# Patient Record
Sex: Female | Born: 1985 | Race: White | Hispanic: No | Marital: Single | Smoking: Never smoker
Health system: Southern US, Community
[De-identification: ages and names within clinical notes are randomized; demographics above are authoritative.]

## PROBLEM LIST (undated history)

## (undated) DIAGNOSIS — F329 Major depressive disorder, single episode, unspecified: Secondary | ICD-10-CM

## (undated) DIAGNOSIS — F419 Anxiety disorder, unspecified: Secondary | ICD-10-CM

## (undated) DIAGNOSIS — F319 Bipolar disorder, unspecified: Secondary | ICD-10-CM

## (undated) DIAGNOSIS — E049 Nontoxic goiter, unspecified: Secondary | ICD-10-CM

## (undated) DIAGNOSIS — T7840XA Allergy, unspecified, initial encounter: Secondary | ICD-10-CM

## (undated) DIAGNOSIS — F32A Depression, unspecified: Secondary | ICD-10-CM

## (undated) HISTORY — DX: Anxiety disorder, unspecified: F41.9

## (undated) HISTORY — DX: Allergy, unspecified, initial encounter: T78.40XA

## (undated) HISTORY — DX: Major depressive disorder, single episode, unspecified: F32.9

## (undated) HISTORY — DX: Bipolar disorder, unspecified: F31.9

## (undated) HISTORY — DX: Depression, unspecified: F32.A

## (undated) HISTORY — DX: Nontoxic goiter, unspecified: E04.9

---
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
## 2007-01-11 ENCOUNTER — Encounter: Admission: RE | Admit: 2007-01-11 | Discharge: 2007-01-11 | Payer: Self-pay | Admitting: Internal Medicine

## 2010-07-15 NOTE — Op Note (Signed)
. [facility]  Patient:    Amber Schultz, Amber Schultz                   MRN: [id number] Proc. Date: 07/31/00 Adm. Date:  [id number] Attending:  Eloise Levels                           Operative Report  PREOPERATIVE DIAGNOSES: 1. A 1.1 cm dysplastic nevus, left abdomen. 2. A 0.8 cm suspicious skin lesion, left upper arm.  POSTOPERATIVE DIAGNOSES: 1. A 1.1 cm dysplastic nevus, left abdomen. 2. A 0.8 cm suspicious skin lesion, left upper arm.  PROCEDURES: 1. Excision of a 1.1 cm dysplastic nevus, left abdomen. 2. Complex closure of 1.9 cm incision, left abdomen. 3. Excision of 0.8 cm suspicious skin lesion, left upper arm. 4. Complex closure of 1.7 cm incision, left upper arm.  SURGEON:  A. Contogiannis, M.D.  ANESTHESIA:  1% lidocaine with epinephrine along with topical EMLA cream.  COMPLICATIONS:  None.  INDICATIONS FOR PROCEDURE:  The patient is a 25 year old Caucasian female who has a dysplastic nevus on the left abdomen as well as a suspicious skin lesion on the left upper arm.  She has been referred to me by [facility]. Danella Deis, M.D., for evaluation and excision.  The patient and her mother request that I proceed with excision of these lesions at this time.  DESCRIPTION OF PROCEDURE:  The patient arrived one hour prior to her surgery and had topical EMLA cream placed over the left abdominal and the left upper arm skin lesions to be excised.  She was then taken back into the minor procedure room and laid on the OR table in the supine position.  The EMLA cream was removed, and the left abdomen and left upper arm were prepped with Betadine and draped in sterile fashion.  The skin and subcutaneous tissues around both skin lesions were then injected with 1% lidocaine with epinephrine.  After adequate hemostasis and anesthesia had taken effect, the procedure was begun.  First, the left abdominal dysplastic nevus was excised with at least 1  mm of normal skin in all directions.  This was excised full-thickness through the skin into the subcutaneous fatty tissues.  The specimen was marked at the 12 oclock position with a silk suture and passed off the table to undergo permanent pathologic section evaluation.  I felt that perhaps part of the margin from the 4 oclock to the 9 oclock position may have been close, so I excised another margin along this area and inked the internal surface.  This was also sent with the specimen for pathologic evaluation.  The skin edges were then undermined for easy closure.  The wound was then closed in complex fashion.  The deeper subcutaneous tissues were closed with a 3-0 Monocryl suture.  The dermal layer was then closed with 3-0 and 4-0 Vicryl Monocryl sutures as appropriate.  The skin was then closed with a 4-0 Monocryl running intracuticular stitch on the skin.  Attention was then turned to the left upper arm skin lesion.  This skin lesion was excised with a millimeter of normal skin all around full-thickness into the subcutaneous tissue.  The lesion was then marked at the 12 oclock position with a silk suture, passed off the table to undergo permanent pathologic section evaluation.  The skin edges were then allowed to relax and the incision closed along the lines of skin tension.  The wound was closed in complex fashion. The deeper subcutaneous tissues were closed with a 3-0 Monocryl suture.  The dermal layer was then closed with 4-0 Monocryl interrupted sutures.  The skin was closed with a 4-0 Monocryl running intracuticular stitch.  Both incisions were dressed with benzoin and Steri-Strips.  There were no complications.  The patient tolerated the procedure well.  She was then discharged home in the care of her mother in stable condition.  She was given the following instructions:  1. Finish taking your Keflex one tablet four times a day. 2. Keep both areas of the wounds dry until your  follow-up appointment. 3. Return to the office tomorrow for a follow-up appointment. 4. Call my office at [phone] if any problems arise or any concerns. DD:  07/31/00 TD:  08/01/00 Job: [id number]

## 2010-07-19 ENCOUNTER — Other Ambulatory Visit: Payer: Self-pay | Admitting: Internal Medicine

## 2010-07-19 DIAGNOSIS — E049 Nontoxic goiter, unspecified: Secondary | ICD-10-CM

## 2010-07-22 ENCOUNTER — Ambulatory Visit
Admission: RE | Admit: 2010-07-22 | Discharge: 2010-07-22 | Disposition: A | Discharge status: Home / Self Care | Payer: Medicare Other | Source: Ambulatory Visit | Attending: Internal Medicine | Admitting: Internal Medicine

## 2010-07-22 DIAGNOSIS — E049 Nontoxic goiter, unspecified: Secondary | ICD-10-CM

## 2010-10-14 ENCOUNTER — Other Ambulatory Visit: Payer: Self-pay | Admitting: *Deleted

## 2010-10-14 DIAGNOSIS — E049 Nontoxic goiter, unspecified: Secondary | ICD-10-CM

## 2010-10-17 ENCOUNTER — Other Ambulatory Visit: Payer: Medicare Other

## 2011-03-09 DIAGNOSIS — F3189 Other bipolar disorder: Secondary | ICD-10-CM | POA: Diagnosis not present

## 2011-03-09 DIAGNOSIS — F410 Panic disorder [episodic paroxysmal anxiety] without agoraphobia: Secondary | ICD-10-CM | POA: Diagnosis not present

## 2011-04-13 DIAGNOSIS — F3189 Other bipolar disorder: Secondary | ICD-10-CM | POA: Diagnosis not present

## 2011-04-13 DIAGNOSIS — F410 Panic disorder [episodic paroxysmal anxiety] without agoraphobia: Secondary | ICD-10-CM | POA: Diagnosis not present

## 2011-07-13 DIAGNOSIS — F410 Panic disorder [episodic paroxysmal anxiety] without agoraphobia: Secondary | ICD-10-CM | POA: Diagnosis not present

## 2011-07-13 DIAGNOSIS — F3189 Other bipolar disorder: Secondary | ICD-10-CM | POA: Diagnosis not present

## 2011-09-05 DIAGNOSIS — R5381 Other malaise: Secondary | ICD-10-CM | POA: Diagnosis not present

## 2011-09-05 DIAGNOSIS — R079 Chest pain, unspecified: Secondary | ICD-10-CM | POA: Diagnosis not present

## 2011-09-05 DIAGNOSIS — R5383 Other fatigue: Secondary | ICD-10-CM | POA: Diagnosis not present

## 2011-09-05 DIAGNOSIS — Z0000 Encounter for general adult medical examination without abnormal findings: Secondary | ICD-10-CM | POA: Diagnosis not present

## 2011-09-12 DIAGNOSIS — H908 Mixed conductive and sensorineural hearing loss, unspecified: Secondary | ICD-10-CM | POA: Diagnosis not present

## 2011-09-12 DIAGNOSIS — Z0000 Encounter for general adult medical examination without abnormal findings: Secondary | ICD-10-CM | POA: Diagnosis not present

## 2011-09-12 DIAGNOSIS — F39 Unspecified mood [affective] disorder: Secondary | ICD-10-CM | POA: Diagnosis not present

## 2011-10-05 DIAGNOSIS — F3189 Other bipolar disorder: Secondary | ICD-10-CM | POA: Diagnosis not present

## 2011-11-17 DIAGNOSIS — Z124 Encounter for screening for malignant neoplasm of cervix: Secondary | ICD-10-CM | POA: Diagnosis not present

## 2011-11-17 DIAGNOSIS — Z0000 Encounter for general adult medical examination without abnormal findings: Secondary | ICD-10-CM | POA: Diagnosis not present

## 2011-11-17 DIAGNOSIS — Z01419 Encounter for gynecological examination (general) (routine) without abnormal findings: Secondary | ICD-10-CM | POA: Diagnosis not present

## 2012-01-08 DIAGNOSIS — F3189 Other bipolar disorder: Secondary | ICD-10-CM | POA: Diagnosis not present

## 2012-03-15 DIAGNOSIS — F3189 Other bipolar disorder: Secondary | ICD-10-CM | POA: Diagnosis not present

## 2012-08-22 DIAGNOSIS — F3189 Other bipolar disorder: Secondary | ICD-10-CM | POA: Diagnosis not present

## 2012-10-10 DIAGNOSIS — F319 Bipolar disorder, unspecified: Secondary | ICD-10-CM | POA: Insufficient documentation

## 2012-10-10 DIAGNOSIS — F3178 Bipolar disorder, in full remission, most recent episode mixed: Secondary | ICD-10-CM | POA: Diagnosis not present

## 2012-10-10 DIAGNOSIS — F411 Generalized anxiety disorder: Secondary | ICD-10-CM | POA: Diagnosis not present

## 2012-11-28 DIAGNOSIS — F411 Generalized anxiety disorder: Secondary | ICD-10-CM | POA: Diagnosis not present

## 2012-11-28 DIAGNOSIS — F3178 Bipolar disorder, in full remission, most recent episode mixed: Secondary | ICD-10-CM | POA: Diagnosis not present

## 2013-03-20 DIAGNOSIS — F4010 Social phobia, unspecified: Secondary | ICD-10-CM | POA: Diagnosis not present

## 2013-03-20 DIAGNOSIS — F411 Generalized anxiety disorder: Secondary | ICD-10-CM | POA: Diagnosis not present

## 2013-03-20 DIAGNOSIS — F3178 Bipolar disorder, in full remission, most recent episode mixed: Secondary | ICD-10-CM | POA: Diagnosis not present

## 2013-06-05 DIAGNOSIS — F3131 Bipolar disorder, current episode depressed, mild: Secondary | ICD-10-CM | POA: Diagnosis not present

## 2013-06-05 DIAGNOSIS — F411 Generalized anxiety disorder: Secondary | ICD-10-CM | POA: Diagnosis not present

## 2013-06-05 DIAGNOSIS — F4010 Social phobia, unspecified: Secondary | ICD-10-CM | POA: Diagnosis not present

## 2013-07-10 DIAGNOSIS — F4010 Social phobia, unspecified: Secondary | ICD-10-CM | POA: Diagnosis not present

## 2013-07-10 DIAGNOSIS — F411 Generalized anxiety disorder: Secondary | ICD-10-CM | POA: Diagnosis not present

## 2013-07-10 DIAGNOSIS — F3178 Bipolar disorder, in full remission, most recent episode mixed: Secondary | ICD-10-CM | POA: Diagnosis not present

## 2013-08-21 DIAGNOSIS — F3110 Bipolar disorder, current episode manic without psychotic features, unspecified: Secondary | ICD-10-CM | POA: Diagnosis not present

## 2013-08-21 DIAGNOSIS — F411 Generalized anxiety disorder: Secondary | ICD-10-CM | POA: Diagnosis not present

## 2013-08-21 DIAGNOSIS — F4010 Social phobia, unspecified: Secondary | ICD-10-CM | POA: Diagnosis not present

## 2013-09-18 DIAGNOSIS — F4010 Social phobia, unspecified: Secondary | ICD-10-CM | POA: Diagnosis not present

## 2013-09-18 DIAGNOSIS — F3110 Bipolar disorder, current episode manic without psychotic features, unspecified: Secondary | ICD-10-CM | POA: Diagnosis not present

## 2013-09-18 DIAGNOSIS — F411 Generalized anxiety disorder: Secondary | ICD-10-CM | POA: Diagnosis not present

## 2013-10-30 DIAGNOSIS — F3110 Bipolar disorder, current episode manic without psychotic features, unspecified: Secondary | ICD-10-CM | POA: Diagnosis not present

## 2013-11-19 DIAGNOSIS — J029 Acute pharyngitis, unspecified: Secondary | ICD-10-CM | POA: Diagnosis not present

## 2013-12-11 DIAGNOSIS — R591 Generalized enlarged lymph nodes: Secondary | ICD-10-CM | POA: Diagnosis not present

## 2013-12-11 DIAGNOSIS — J029 Acute pharyngitis, unspecified: Secondary | ICD-10-CM | POA: Diagnosis not present

## 2013-12-12 DIAGNOSIS — F310 Bipolar disorder, current episode hypomanic: Secondary | ICD-10-CM | POA: Diagnosis not present

## 2013-12-12 DIAGNOSIS — F411 Generalized anxiety disorder: Secondary | ICD-10-CM | POA: Diagnosis not present

## 2013-12-12 DIAGNOSIS — F4010 Social phobia, unspecified: Secondary | ICD-10-CM | POA: Diagnosis not present

## 2013-12-24 DIAGNOSIS — H5213 Myopia, bilateral: Secondary | ICD-10-CM | POA: Diagnosis not present

## 2013-12-24 DIAGNOSIS — H52203 Unspecified astigmatism, bilateral: Secondary | ICD-10-CM | POA: Diagnosis not present

## 2013-12-30 DIAGNOSIS — J310 Chronic rhinitis: Secondary | ICD-10-CM | POA: Diagnosis not present

## 2013-12-30 DIAGNOSIS — J351 Hypertrophy of tonsils: Secondary | ICD-10-CM | POA: Diagnosis not present

## 2014-01-08 DIAGNOSIS — Z0142 Encounter for cervical smear to confirm findings of recent normal smear following initial abnormal smear: Secondary | ICD-10-CM | POA: Diagnosis not present

## 2014-01-08 DIAGNOSIS — Z124 Encounter for screening for malignant neoplasm of cervix: Secondary | ICD-10-CM | POA: Diagnosis not present

## 2014-01-08 DIAGNOSIS — Z113 Encounter for screening for infections with a predominantly sexual mode of transmission: Secondary | ICD-10-CM | POA: Diagnosis not present

## 2014-01-08 DIAGNOSIS — Z0000 Encounter for general adult medical examination without abnormal findings: Secondary | ICD-10-CM | POA: Diagnosis not present

## 2014-01-08 DIAGNOSIS — N943 Premenstrual tension syndrome: Secondary | ICD-10-CM | POA: Diagnosis not present

## 2014-01-08 DIAGNOSIS — Z1272 Encounter for screening for malignant neoplasm of vagina: Secondary | ICD-10-CM | POA: Diagnosis not present

## 2014-01-08 DIAGNOSIS — Z01419 Encounter for gynecological examination (general) (routine) without abnormal findings: Secondary | ICD-10-CM | POA: Diagnosis not present

## 2014-01-08 DIAGNOSIS — Z118 Encounter for screening for other infectious and parasitic diseases: Secondary | ICD-10-CM | POA: Diagnosis not present

## 2014-01-08 DIAGNOSIS — Z793 Long term (current) use of hormonal contraceptives: Secondary | ICD-10-CM | POA: Diagnosis not present

## 2014-01-16 DIAGNOSIS — F319 Bipolar disorder, unspecified: Secondary | ICD-10-CM | POA: Diagnosis not present

## 2014-03-03 DIAGNOSIS — J351 Hypertrophy of tonsils: Secondary | ICD-10-CM | POA: Diagnosis not present

## 2014-03-18 DIAGNOSIS — F3174 Bipolar disorder, in full remission, most recent episode manic: Secondary | ICD-10-CM | POA: Diagnosis not present

## 2014-03-18 DIAGNOSIS — J351 Hypertrophy of tonsils: Secondary | ICD-10-CM | POA: Diagnosis not present

## 2014-03-18 DIAGNOSIS — Z681 Body mass index (BMI) 19 or less, adult: Secondary | ICD-10-CM | POA: Diagnosis not present

## 2014-03-18 DIAGNOSIS — N943 Premenstrual tension syndrome: Secondary | ICD-10-CM | POA: Diagnosis not present

## 2014-03-18 DIAGNOSIS — E042 Nontoxic multinodular goiter: Secondary | ICD-10-CM | POA: Diagnosis not present

## 2014-04-24 DIAGNOSIS — F319 Bipolar disorder, unspecified: Secondary | ICD-10-CM | POA: Diagnosis not present

## 2014-04-24 DIAGNOSIS — F411 Generalized anxiety disorder: Secondary | ICD-10-CM | POA: Diagnosis not present

## 2014-04-24 DIAGNOSIS — F4010 Social phobia, unspecified: Secondary | ICD-10-CM | POA: Diagnosis not present

## 2014-06-05 DIAGNOSIS — F3132 Bipolar disorder, current episode depressed, moderate: Secondary | ICD-10-CM | POA: Diagnosis not present

## 2014-06-05 DIAGNOSIS — F4010 Social phobia, unspecified: Secondary | ICD-10-CM | POA: Diagnosis not present

## 2014-06-05 DIAGNOSIS — F902 Attention-deficit hyperactivity disorder, combined type: Secondary | ICD-10-CM | POA: Diagnosis not present

## 2014-06-05 DIAGNOSIS — F411 Generalized anxiety disorder: Secondary | ICD-10-CM | POA: Diagnosis not present

## 2014-07-03 DIAGNOSIS — Z681 Body mass index (BMI) 19 or less, adult: Secondary | ICD-10-CM | POA: Diagnosis not present

## 2014-07-03 DIAGNOSIS — R197 Diarrhea, unspecified: Secondary | ICD-10-CM | POA: Diagnosis not present

## 2014-07-03 DIAGNOSIS — K529 Noninfective gastroenteritis and colitis, unspecified: Secondary | ICD-10-CM | POA: Diagnosis not present

## 2014-09-25 DIAGNOSIS — R350 Frequency of micturition: Secondary | ICD-10-CM | POA: Diagnosis not present

## 2014-09-28 DIAGNOSIS — Z681 Body mass index (BMI) 19 or less, adult: Secondary | ICD-10-CM | POA: Diagnosis not present

## 2014-09-28 DIAGNOSIS — R319 Hematuria, unspecified: Secondary | ICD-10-CM | POA: Diagnosis not present

## 2014-09-28 DIAGNOSIS — N390 Urinary tract infection, site not specified: Secondary | ICD-10-CM | POA: Diagnosis not present

## 2014-11-02 DIAGNOSIS — N3000 Acute cystitis without hematuria: Secondary | ICD-10-CM | POA: Diagnosis not present

## 2014-11-02 DIAGNOSIS — Z882 Allergy status to sulfonamides status: Secondary | ICD-10-CM | POA: Diagnosis not present

## 2014-11-02 DIAGNOSIS — Z79899 Other long term (current) drug therapy: Secondary | ICD-10-CM | POA: Diagnosis not present

## 2014-11-02 DIAGNOSIS — Z880 Allergy status to penicillin: Secondary | ICD-10-CM | POA: Diagnosis not present

## 2014-11-02 DIAGNOSIS — Z886 Allergy status to analgesic agent status: Secondary | ICD-10-CM | POA: Diagnosis not present

## 2014-12-11 DIAGNOSIS — F4010 Social phobia, unspecified: Secondary | ICD-10-CM | POA: Diagnosis not present

## 2014-12-11 DIAGNOSIS — F411 Generalized anxiety disorder: Secondary | ICD-10-CM | POA: Diagnosis not present

## 2014-12-11 DIAGNOSIS — F3132 Bipolar disorder, current episode depressed, moderate: Secondary | ICD-10-CM | POA: Diagnosis not present

## 2014-12-11 DIAGNOSIS — F902 Attention-deficit hyperactivity disorder, combined type: Secondary | ICD-10-CM | POA: Diagnosis not present

## 2014-12-31 DIAGNOSIS — Z793 Long term (current) use of hormonal contraceptives: Secondary | ICD-10-CM | POA: Diagnosis not present

## 2014-12-31 DIAGNOSIS — Z3041 Encounter for surveillance of contraceptive pills: Secondary | ICD-10-CM | POA: Diagnosis not present

## 2014-12-31 DIAGNOSIS — Z3202 Encounter for pregnancy test, result negative: Secondary | ICD-10-CM | POA: Diagnosis not present

## 2015-05-16 ENCOUNTER — Ambulatory Visit (INDEPENDENT_AMBULATORY_CARE_PROVIDER_SITE_OTHER): Payer: Medicare Other | Admitting: Emergency Medicine

## 2015-05-16 VITALS — BP 130/76 | HR 81 | Temp 98.1°F | Resp 14 | Ht 69.25 in | Wt 132.0 lb

## 2015-05-16 DIAGNOSIS — R350 Frequency of micturition: Secondary | ICD-10-CM

## 2015-05-16 DIAGNOSIS — F3170 Bipolar disorder, currently in remission, most recent episode unspecified: Secondary | ICD-10-CM | POA: Diagnosis not present

## 2015-05-16 LAB — POCT URINALYSIS DIP (MANUAL ENTRY)
BILIRUBIN UA: NEGATIVE
BILIRUBIN UA: NEGATIVE
Blood, UA: NEGATIVE
GLUCOSE UA: NEGATIVE
LEUKOCYTES UA: NEGATIVE
Nitrite, UA: NEGATIVE
PROTEIN UA: NEGATIVE
SPEC GRAV UA: 1.01
Urobilinogen, UA: 0.2
pH, UA: 7

## 2015-05-16 LAB — POCT CBC
GRANULOCYTE PERCENT: 89.1 % — AB (ref 37–80)
HEMATOCRIT: 37.9 % (ref 37.7–47.9)
Hemoglobin: 13.1 g/dL (ref 12.2–16.2)
Lymph, poc: 0.8 (ref 0.6–3.4)
MCH: 30 pg (ref 27–31.2)
MCHC: 34.5 g/dL (ref 31.8–35.4)
MCV: 87 fL (ref 80–97)
MID (CBC): 0.2 (ref 0–0.9)
MPV: 9.6 fL (ref 0–99.8)
POC GRANULOCYTE: 8 — AB (ref 2–6.9)
POC LYMPH %: 8.5 % — AB (ref 10–50)
POC MID %: 2.4 % (ref 0–12)
Platelet Count, POC: 144 10*3/uL (ref 142–424)
RBC: 4.35 M/uL (ref 4.04–5.48)
RDW, POC: 13 %
WBC: 9 10*3/uL (ref 4.6–10.2)

## 2015-05-16 LAB — POC MICROSCOPIC URINALYSIS (UMFC): MUCUS RE: ABSENT

## 2015-05-16 LAB — POCT URINE PREGNANCY: PREG TEST UR: NEGATIVE

## 2015-05-16 LAB — GLUCOSE, POCT (MANUAL RESULT ENTRY): POC Glucose: 89 mg/dl (ref 70–99)

## 2015-05-16 NOTE — Progress Notes (Addendum)
By signing my name below, I, Javier Docker, attest that this documentation has been prepared under the direction and in the presence of Earl Lites, MD. Electronically Signed: Javier Docker, ER Scribe. 05/16/2015. 3:15 PM.  Chief Complaint:  Chief Complaint  Patient presents with  . Urinary Tract Infection  . Urinary Frequency  . Labs Only    Kidney function  . Side effect    To Macrobid   HPI: Amber Schultz is a 30 y.o. female who reports to [facility] today reporting for a kidney fx test. She takes lithium, one  in the morning and one  at night. She has been on that medication for 17 years. She was seen with urinary frequency and foamy urine one week ago by Dr. Wylene Simmer, and was put on macrobid. Since that time she has felt disoriented, which she associates with the Macrobid. She has had severe drug side effects in the past. She does not have a period due to continuous birth control. She has had no change in sexual partners. Her psychiatrist asked her to go get kidney fx test when she started macrobid.    Past Medical History  Diagnosis Date  . Allergy   . Anxiety   . Depression   . Bipolar 1 disorder (HCC)   . Goiter    History reviewed. No pertinent past surgical history. Social History   Social History  . Marital Status: Single    Spouse Name: N/A  . Number of Children: N/A  . Years of Education: N/A   Social History Main Topics  . Smoking status: Never Smoker   . Smokeless tobacco: None  . Alcohol Use: None  . Drug Use: None  . Sexual Activity: Not Asked   Other Topics Concern  . None   Social History Narrative  . None   Family History  Problem Relation Age of Onset  . Hypertension Mother   . Cancer Maternal Grandmother   . Cancer Maternal Grandfather   . Hypertension Paternal Grandmother   . Cancer Paternal Grandfather    Allergies  Allergen Reactions  . Aleve [Naproxen Sodium]   . Aspirin   . Ciprofloxacin Hcl   . Ibuprofen   .  Penicillins   . Sulfa Antibiotics    Prior to Admission medications   Medication Sig Start Date End Date Taking? Authorizing Provider  drospirenone-ethinyl estradiol (YAZ,GIANVI,LORYNA) 3-0.02 MG tablet Take 1 tablet by mouth daily.   Yes Historical Provider, MD  lamoTRIgine (LAMICTAL) 100 MG tablet Take 100 mg by mouth daily.   Yes Historical Provider, MD  lithium carbonate 300 MG capsule Take 300 mg by mouth 3 (three) times daily with meals.   Yes Historical Provider, MD  nitrofurantoin, macrocrystal-monohydrate, (MACROBID) 100 MG capsule Take 100 mg by mouth 2 (two) times daily.   Yes Historical Provider, MD    ROS: The patient denies fevers, chills, night sweats, unintentional weight loss, chest pain, palpitations, wheezing, dyspnea on exertion, nausea, vomiting, abdominal pain, dysuria, hematuria, melena, numbness, weakness, or tingling.   All other systems have been reviewed and were otherwise negative with the exception of those mentioned in the HPI and as above.    PHYSICAL EXAM: Filed Vitals:   05/16/15 1408  BP: 130/76  Pulse: 81  Temp: 98.1 F (36.7 C)  Resp: 14   Body mass index is 19.35 kg/(m^2).   General: Alert, no acute distress HEENT:  Normocephalic, atraumatic, oropharynx patent. Eye: Nonie Hoyer [facility] Cardiovascular:  Regular rate and rhythm,  no rubs murmurs or gallops.  No Carotid bruits, radial pulse intact. No pedal edema.  Respiratory: Clear to auscultation bilaterally.  No wheezes, rales, or rhonchi.  No cyanosis, no use of accessory musculature Abdominal: No organomegaly, abdomen is soft and non-tender, positive bowel sounds.  No masses. Musculoskeletal: Gait intact. No edema, tenderness Skin: No rashes. Neurologic: Facial musculature symmetric. Psychiatric: Patient acts appropriately throughout our interaction. Lymphatic: No cervical or submandibular lymphadenopathy    LABS: Results for orders placed or performed in visit on 05/16/15  POCT CBC  Result  Value Ref Range   WBC 9.0 4.6 - 10.2 K/uL   Lymph, poc 0.8 0.6 - 3.4   POC LYMPH PERCENT 8.5 (A) 10 - 50 %L   MID (cbc) 0.2 0 - 0.9   POC MID % 2.4 0 - 12 %M   POC Granulocyte 8.0 (A) 2 - 6.9   Granulocyte percent 89.1 (A) 37 - 80 %G   RBC 4.35 4.04 - 5.48 M/uL   Hemoglobin 13.1 12.2 - 16.2 g/dL   HCT, POC 84.637.9 96.237.7 - 47.9 %   MCV 87.0 80 - 97 fL   MCH, POC 30.0 27 - 31.2 pg   MCHC 34.5 31.8 - 35.4 g/dL   RDW, POC 95.213.0 %   Platelet Count, POC 144 142 - 424 K/uL   MPV 9.6 0 - 99.8 fL  POCT glucose (manual entry)  Result Value Ref Range   POC Glucose 89 70 - 99 mg/dl  POCT urinalysis dipstick  Result Value Ref Range   Color, UA yellow yellow   Clarity, UA clear clear   Glucose, UA negative negative   Bilirubin, UA negative negative   Ketones, POC UA negative negative   Spec Grav, UA 1.010    Blood, UA negative negative   pH, UA 7.0    Protein Ur, POC negative negative   Urobilinogen, UA 0.2    Nitrite, UA Negative Negative   Leukocytes, UA Negative Negative  POCT Microscopic Urinalysis ([facility])  Result Value Ref Range   WBC,UR,HPF,POC Few (A) None WBC/hpf   RBC,UR,HPF,POC None None RBC/hpf   Bacteria Few (A) None, Too numerous to count   Mucus Absent Absent   Epithelial Cells, UR Per Microscopy Few (A) None, Too numerous to count cells/hpf   Yeast, UA Present   POCT urine pregnancy  Result Value Ref Range   Preg Test, Ur Negative Negative    EKG/XRAY:   [facility]   ASSESSMENT/PLAN: Blood work and urine looked normal. Go ahead and do a urine culture and check renal function and lithium level.I personally performed the services described in this documentation, which was scribed in my presence. The recorded information has been reviewed and is accurate. Patient was the same sexual partner for years does not desire STD testing at the present time. I personally performed the services described in this documentation, which was scribed in  my presence. The recorded information has been reviewed and is accurate.  Gross sideeffects, risk and benefits, and alternatives of medications d/w patient. Patient is aware that all medications have potential sideeffects and we are unable to predict every sideeffect or drug-drug interaction that may occur.  Lesle ChrisSteven Burt Piatek MD 05/16/2015 3:15 PM

## 2015-05-16 NOTE — Patient Instructions (Signed)
Please stop your Macrobid. I will call you with culture results. I will call you with results of your lithium level and renal function tests.

## 2015-05-17 ENCOUNTER — Telehealth: Payer: Self-pay

## 2015-05-17 ENCOUNTER — Ambulatory Visit (INDEPENDENT_AMBULATORY_CARE_PROVIDER_SITE_OTHER): Payer: Medicare Other | Admitting: Internal Medicine

## 2015-05-17 DIAGNOSIS — F419 Anxiety disorder, unspecified: Secondary | ICD-10-CM

## 2015-05-17 DIAGNOSIS — R202 Paresthesia of skin: Secondary | ICD-10-CM

## 2015-05-17 DIAGNOSIS — R350 Frequency of micturition: Secondary | ICD-10-CM

## 2015-05-17 LAB — BASIC METABOLIC PANEL WITH GFR
BUN: 10 mg/dL (ref 7–25)
CO2: 26 mmol/L (ref 20–31)
Calcium: 9.3 mg/dL (ref 8.6–10.2)
Chloride: 105 mmol/L (ref 98–110)
Creat: 0.77 mg/dL (ref 0.50–1.10)
Glucose, Bld: 84 mg/dL (ref 65–99)
POTASSIUM: 4.3 mmol/L (ref 3.5–5.3)
SODIUM: 138 mmol/L (ref 135–146)

## 2015-05-17 LAB — LITHIUM LEVEL: LITHIUM LVL: 0.6 meq/L — AB (ref 0.80–1.40)

## 2015-05-17 NOTE — Telephone Encounter (Signed)
I called and spoke with the father. I told him her blood work and urine looked essentially normal lithium level was somewhat low at 0.6. He states the problems with her back and legs began last night. I advised him to bring her back in for reevaluation of these problems. She did not have these symptoms when seen yesterday.

## 2015-05-17 NOTE — Patient Instructions (Signed)
Cephalosporins  [email or web address]  Results for orders placed or performed in visit on 05/16/15  BASIC METABOLIC PANEL WITH GFR  Result Value Ref Range   Sodium 138 135 - 146 mmol/L   Potassium 4.3 3.5 - 5.3 mmol/L   Chloride 105 98 - 110 mmol/L   CO2 26 20 - 31 mmol/L   Glucose, Bld 84 65 - 99 mg/dL   BUN 10 7 - 25 mg/dL   Creat 9.600.77 4.540.50 - 0.981.10 mg/dL   Calcium 9.3 8.6 - 11.910.2 mg/dL   GFR, Est African American >89 >=60 mL/min   GFR, Est Non African American >89 >=60 mL/min  Lithium level  Result Value Ref Range   Lithium Lvl 0.60 (L) 0.80 - 1.40 mEq/L  POCT CBC  Result Value Ref Range   WBC 9.0 4.6 - 10.2 K/uL   Lymph, poc 0.8 0.6 - 3.4   POC LYMPH PERCENT 8.5 (A) 10 - 50 %L   MID (cbc) 0.2 0 - 0.9   POC MID % 2.4 0 - 12 %M   POC Granulocyte 8.0 (A) 2 - 6.9   Granulocyte percent 89.1 (A) 37 - 80 %G   RBC 4.35 4.04 - 5.48 M/uL   Hemoglobin 13.1 12.2 - 16.2 g/dL   HCT, POC 14.737.9 82.937.7 - 47.9 %   MCV 87.0 80 - 97 fL   MCH, POC 30.0 27 - 31.2 pg   MCHC 34.5 31.8 - 35.4 g/dL   RDW, POC 56.213.0 %   Platelet Count, POC 144 142 - 424 K/uL   MPV 9.6 0 - 99.8 fL  POCT glucose (manual entry)  Result Value Ref Range   POC Glucose 89 70 - 99 mg/dl  POCT urinalysis dipstick  Result Value Ref Range   Color, UA yellow yellow   Clarity, UA clear clear   Glucose, UA negative negative   Bilirubin, UA negative negative   Ketones, POC UA negative negative   Spec Grav, UA 1.010    Blood, UA negative negative   pH, UA 7.0    Protein Ur, POC negative negative   Urobilinogen, UA 0.2    Nitrite, UA Negative Negative   Leukocytes, UA Negative Negative  POCT Microscopic Urinalysis ([facility])  Result Value Ref Range   WBC,UR,HPF,POC Few (A) None WBC/hpf   RBC,UR,HPF,POC None None RBC/hpf   Bacteria Few (A) None, Too numerous to count   Mucus Absent Absent   Epithelial Cells, UR Per Microscopy Few (A) None, Too numerous to count cells/hpf   Yeast, UA Present   POCT urine pregnancy  Result Value Ref  Range   Preg Test, Ur Negative Negative

## 2015-05-17 NOTE — Telephone Encounter (Signed)
Amber Schultz states her daughter was seen yesterday and her symptoms are getting worse. She now have Low Back Pain that have moved to her hips and knees now, she would like to speak  With someone. Please call [phone]

## 2015-05-17 NOTE — Progress Notes (Signed)
Subjective:  By signing my name below, I, Linus Galas, attest that this documentation has been prepared under the direction and in the presence of Robert P. Merla Riches, MD. Electronically Signed: Linus Galas, ED Scribe. 05/17/2015. 5:07 PM.   Patient ID: Amber Schultz, female    DOB: Sep 30, 1985, 30 y.o.   MRN: [id number] Chief Complaint  Patient presents with  . Allergic Reaction    Possible reaction to macrobid, severe back pain, tingling in legs    HPI  HPI Comments: Amber Schultz is a 30 y.o. female who presents to the [facility] complaining of pain and paresthesia in her back radiating to her knees that began 1 day ago. Pt also reports vomiting. Pt states she was seen here yesterday for an evaluation of a possible UTI not responding to macrobid, because she was feeling dizzy and "confused". She was seen with urinary frequency and foamy urine one week ago by Dr. Wylene Simmer, and was put on macrobid. See 3/19 OV .   After she left here, she states she began having a tingling sensation described as "pins and needles" in her back to her knees, and vomiting. Pt notes she is currently relatively asymptomatic. Pt denies any palpitation, dysuria, urinary frequency, urgency or any other symptoms at this time. No prior back injury.   Pt states that she was started on Macrobid Dr T as noted for the uti sxt of frequency and her hx of freq utis.. She took her last dose yesterday. Pt reports mood changes one day after taking Macrobid. Pt has a history of several drug allergies which affects her mood but denies it ever causing a skin reaction thus far.   Followed at [facility] for mood disorder  She was at [facility] in [place] with my son Romeo Apple  Past Medical History  Diagnosis Date  . Allergy   . Anxiety   . Depression   . Bipolar 1 disorder (HCC)   . Goiter    Current Outpatient Prescriptions on File Prior to Visit  Medication Sig Dispense Refill  . drospirenone-ethinyl  estradiol (YAZ,GIANVI,LORYNA) 3-0.02 MG tablet Take 1 tablet by mouth daily.    Marland Kitchen lamoTRIgine (LAMICTAL) 100 MG tablet Take 100 mg by mouth daily.    Marland Kitchen lithium carbonate 300 MG capsule Take 300 mg by mouth 3 (three) times daily with meals.    . nitrofurantoin, macrocrystal-monohydrate, (MACROBID) 100 MG capsule Take 100 mg by mouth 2 (two) times daily.     No current facility-administered medications on file prior to visit.   Allergies  Allergen Reactions  . Aleve [Naproxen Sodium]   . Aspirin   . Ciprofloxacin Hcl   . Ibuprofen   . Penicillins   . Sulfa Antibiotics    Review of Systems  Gastrointestinal: Positive for vomiting.  Neurological:       + paresthesia      Objective:   Physical Exam  Constitutional: She is oriented to person, place, and time. She appears well-developed and well-nourished. No distress.  HENT:  Head: Normocephalic and atraumatic.  Eyes: Conjunctivae and EOM are normal. Pupils are equal, round, and reactive to light.  Neck: No thyromegaly present.  Cardiovascular: Normal rate, regular rhythm, normal heart sounds and intact distal pulses.   No murmur heard. Pulmonary/Chest: Effort normal and breath sounds normal.  Abdominal: She exhibits no distension. There is no tenderness.  No flk tend to perc  Musculoskeletal: She exhibits no edema.  Lymphadenopathy:    She has no  cervical adenopathy.  Neurological: She is alert and oriented to person, place, and time. She has normal reflexes. No cranial nerve deficit. She exhibits normal muscle tone. Coordination normal.  SLR neg 90 bilat  Skin: Skin is warm and dry.  Nursing note and vitals reviewed. was nl tho not recorded for some reason Here with Mom--makes good eye contact/not confused/asks appr questions/not grandiose or with flights of ideas Very concerned with sxtoms    Assessment & Plan:  Paresthesias  Anxiety  Increased urinary frequency  Underlying mood disorder-[facility]  Reassured re nl  exam Labs from last ov all wnl Results for orders placed or performed in visit on 05/16/15  Urine culture  Result Value Ref Range   Colony Count NO GROWTH    Organism ID, Bacteria NO GROWTH   BASIC METABOLIC PANEL WITH GFR  Result Value Ref Range   Sodium 138 135 - 146 mmol/L   Potassium 4.3 3.5 - 5.3 mmol/L   Chloride 105 98 - 110 mmol/L   CO2 26 20 - 31 mmol/L   Glucose, Bld 84 65 - 99 mg/dL   BUN 10 7 - 25 mg/dL   Creat 4.780.77 2.950.50 - 6.211.10 mg/dL   Calcium 9.3 8.6 - 30.810.2 mg/dL   GFR, Est African American >89 >=60 mL/min   GFR, Est Non African American >89 >=60 mL/min  Lithium level  Result Value Ref Range   Lithium Lvl 0.60 (L) 0.80 - 1.40 mEq/L  POCT CBC  Result Value Ref Range   WBC 9.0 4.6 - 10.2 K/uL   Lymph, poc 0.8 0.6 - 3.4   POC LYMPH PERCENT 8.5 (A) 10 - 50 %L   MID (cbc) 0.2 0 - 0.9   POC MID % 2.4 0 - 12 %M   POC Granulocyte 8.0 (A) 2 - 6.9   Granulocyte percent 89.1 (A) 37 - 80 %G   RBC 4.35 4.04 - 5.48 M/uL   Hemoglobin 13.1 12.2 - 16.2 g/dL   HCT, POC 65.737.9 84.637.7 - 47.9 %   MCV 87.0 80 - 97 fL   MCH, POC 30.0 27 - 31.2 pg   MCHC 34.5 31.8 - 35.4 g/dL   RDW, POC 96.213.0 %   Platelet Count, POC 144 142 - 424 K/uL   MPV 9.6 0 - 99.8 fL  POCT glucose (manual entry)  Result Value Ref Range   POC Glucose 89 70 - 99 mg/dl  POCT urinalysis dipstick  Result Value Ref Range   Color, UA yellow yellow   Clarity, UA clear clear   Glucose, UA negative negative   Bilirubin, UA negative negative   Ketones, POC UA negative negative   Spec Grav, UA 1.010    Blood, UA negative negative   pH, UA 7.0    Protein Ur, POC negative negative   Urobilinogen, UA 0.2    Nitrite, UA Negative Negative   Leukocytes, UA Negative Negative  POCT Microscopic Urinalysis ([facility])  Result Value Ref Range   WBC,UR,HPF,POC Few (A) None WBC/hpf   RBC,UR,HPF,POC None None RBC/hpf   Bacteria Few (A) None, Too numerous to count   Mucus Absent Absent   Epithelial Cells, UR Per Microscopy Few  (A) None, Too numerous to count cells/hpf   Yeast, UA Present   POCT urine pregnancy  Result Value Ref Range   Preg Test, Ur Negative Negative   reassured re lithium as this is usual level she says Reassured re health and no need for rx Stop macrobid Next uti try  keflex if cult pos--don't treat otherwise Notes to [facility] Dr Frankey Poot  I have completed the patient encounter in its entirety as documented by the scribe, with editing by me where necessary. Robert P. Merla Riches, M.D.

## 2015-05-18 ENCOUNTER — Encounter: Payer: Self-pay | Admitting: Internal Medicine

## 2015-05-18 DIAGNOSIS — F419 Anxiety disorder, unspecified: Secondary | ICD-10-CM | POA: Insufficient documentation

## 2015-05-18 LAB — URINE CULTURE
Colony Count: NO GROWTH
ORGANISM ID, BACTERIA: NO GROWTH

## 2015-05-21 ENCOUNTER — Encounter: Payer: Self-pay | Admitting: *Deleted

## 2015-05-21 ENCOUNTER — Telehealth: Payer: Self-pay

## 2015-05-21 NOTE — Telephone Encounter (Signed)
Pt saw Dr. Merla Richesoolittle and she was very satisfied with her visit. She wants to know who he is recommending his pts to when he retires. It can be a PA as well.  Please advise  [phone]

## 2015-05-26 NOTE — Telephone Encounter (Signed)
Amber Schultz or Amber LennertSarah Weber in our office

## 2015-05-27 NOTE — Telephone Encounter (Signed)
LM with names of providers.

## 2015-06-08 ENCOUNTER — Ambulatory Visit (INDEPENDENT_AMBULATORY_CARE_PROVIDER_SITE_OTHER): Payer: Medicare Other | Admitting: Family Medicine

## 2015-06-08 VITALS — BP 112/76 | HR 80 | Temp 98.4°F | Resp 16 | Ht 69.5 in | Wt 132.0 lb

## 2015-06-08 DIAGNOSIS — R202 Paresthesia of skin: Secondary | ICD-10-CM

## 2015-06-08 DIAGNOSIS — R300 Dysuria: Secondary | ICD-10-CM | POA: Diagnosis not present

## 2015-06-08 LAB — POC MICROSCOPIC URINALYSIS (UMFC): Mucus: ABSENT

## 2015-06-08 LAB — POCT URINALYSIS DIP (MANUAL ENTRY)
Bilirubin, UA: NEGATIVE
Blood, UA: NEGATIVE
Glucose, UA: NEGATIVE
Ketones, POC UA: NEGATIVE
LEUKOCYTES UA: NEGATIVE
NITRITE UA: NEGATIVE
PH UA: 6.5
PROTEIN UA: NEGATIVE
Spec Grav, UA: <=1.005
UROBILINOGEN UA: 0.2

## 2015-06-08 MED ORDER — CEPHALEXIN 500 MG PO CAPS: 500.0000 mg | ORAL_CAPSULE | Freq: Three times a day (TID) | ORAL | Status: DC

## 2015-06-08 NOTE — Assessment & Plan Note (Addendum)
UA today, if +, tx with keflex and send for UCx If  continues to have Sx, consider urology referral for possible interstitial cystitis and cystoscopy.

## 2015-06-08 NOTE — Progress Notes (Signed)
Amber Schultz is a 30 y.o. female who presents today for bladder irritation.  Bladder irritation - Ongoing now for about 4-5 weeks with ongoing bladder pressure/tingling and occasional dysuria.  No fever, chills, sweats, nausea, vomiting diarrhea.  previously tx with Cipro and Macrobid where she had systemic rash and nausea and has been off these medications since then.  Also tx for bipolar disorder in which she is on lithium for.    Past Medical History  Diagnosis Date  . Allergy   . Anxiety   . Depression   . Bipolar 1 disorder (HCC)   . Goiter     History  Smoking status  . Never Smoker   Smokeless tobacco  . Not on file    Family History  Problem Relation Age of Onset  . Hypertension Mother   . Cancer Maternal Grandmother   . Cancer Maternal Grandfather   . Hypertension Paternal Grandmother   . Cancer Paternal Grandfather     Current Outpatient Prescriptions on File Prior to Visit  Medication Sig Dispense Refill  . drospirenone-ethinyl estradiol (YAZ,GIANVI,LORYNA) 3-0.02 MG tablet Take 1 tablet by mouth daily.    Marland Kitchen. lamoTRIgine (LAMICTAL) 100 MG tablet Take 100 mg by mouth daily.    Marland Kitchen. lithium carbonate 300 MG capsule Take 300 mg by mouth 3 (three) times daily with meals.    . nitrofurantoin, macrocrystal-monohydrate, (MACROBID) 100 MG capsule Take 100 mg by mouth 2 (two) times daily.     No current facility-administered medications on file prior to visit.    ROS: Per HPI.  All other systems reviewed and are negative.   Physical Exam Filed Vitals:   06/08/15 1619  BP: 112/76  Pulse: 80  Temp: 98.4 F (36.9 C)  Resp: 16    Physical Examination: General appearance - alert, well appearing, and in no distress Chest - clear to auscultation, no wheezes, rales or rhonchi, symmetric air entry Heart - normal rate and regular rhythm Abd: Soft/NT/ND, no CVA TTP.

## 2015-06-10 LAB — URINE CULTURE
Colony Count: NO GROWTH
Organism ID, Bacteria: NO GROWTH

## 2015-07-01 ENCOUNTER — Ambulatory Visit (INDEPENDENT_AMBULATORY_CARE_PROVIDER_SITE_OTHER): Payer: Medicare Other | Admitting: Physician Assistant

## 2015-07-01 VITALS — BP 130/70 | HR 72 | Temp 98.6°F | Resp 16 | Ht 69.25 in | Wt 131.4 lb

## 2015-07-01 DIAGNOSIS — F410 Panic disorder [episodic paroxysmal anxiety] without agoraphobia: Secondary | ICD-10-CM | POA: Diagnosis not present

## 2015-07-01 DIAGNOSIS — S161XXA Strain of muscle, fascia and tendon at neck level, initial encounter: Secondary | ICD-10-CM

## 2015-07-01 MED ORDER — HYDROXYZINE PAMOATE 25 MG PO CAPS: ORAL_CAPSULE | ORAL | Status: DC

## 2015-07-01 NOTE — Patient Instructions (Addendum)
Take 1 capsule hydroxyzine as needed for panic attacks. Focus on deep breathing. Do activities like fishing to relieve your stress. Contact Dr. Frankey PootBeyer if you continue to have panic attacks and need to start back on gabapentin.  Tylenol and heating pad for your neck pain. Return if not better in 1 week or if symptoms worsen at any time    IF you received an x-ray today, you will receive an invoice from [facility]. Please contact [facility] at [phone] with questions or concerns regarding your invoice.   IF you received labwork today, you will receive an invoice from [facility]. Please contact [facility] at [phone] with questions or concerns regarding your invoice.   Our billing staff will not be able to assist you with questions regarding bills from these companies.  You will be contacted with the lab results as soon as they are available. The fastest way to get your results is to activate your My Chart account. Instructions are located on the last page of this paperwork. If you have not heard from us regarding the results in 2 weeks, please contact this office.

## 2015-07-01 NOTE — Progress Notes (Signed)
[facility] [address], [place] [id number] [phone]  Date:  07/01/2015   Name:  Amber ClementJennifer C Schultz   DOB:  04/22/1985   MRN:  [id number]  PCP:  No primary care provider on file.    Chief Complaint: Panic Attack and Headache   History of Present Illness:  This is a 30 y.o. female with PMH bipolar I disorder who is presenting with panic attacks. She used to have anxiety and panic attacks but has not had problems in the past 2 years. She states she was on disability for several years d/t bipolar I disorder. She states she decided to go to back to college and has been doing very well. She is valedictorian. She is graduating in 2 weeks and is feeling very stressed. Starting 1 week ago she started waking during the night with panic attacks. Has happened every other night for the past week. No problems during the day. She wakes with rapid breathing, rapid HR. Lasts for 30 minutes or less. Has trouble getting back to sleep after. She [facility] Therap[facility] Dr. Frankey PootBeyer. She has been on lithium for the past 14 year and lamictal for the past 11 years. No recent changes in dosing. No changes in mood other than anxiety. She states she has been on several meds in the past for anxiety. Benzos cause too much sedation. Gabapentin worked the best.  She is also complaining of right sided neck pain x 1 week. Happened around the same time as the panic attack. She gets radiating pain into her right forehead with neck movement. When she is still, she has no pain. Denies weakness, numbness, blurred vision, n/v, photophobia. She has been trying heat and tylenol and helping some.  Review of Systems:  Review of Systems See HPI  Patient Active Problem List   Diagnosis Date Noted  . Dysuria 06/08/2015  . Anxiety 05/18/2015  . Bipolar I disorder (HCC) 10/10/2012    Prior to Admission medications   Medication Sig Start Date End Date Taking? Authorizing Provider  drospirenone-ethinyl  estradiol (YAZ,GIANVI,LORYNA) 3-0.02 MG tablet Take 1 tablet by mouth daily.   Yes Historical Provider, MD  lamoTRIgine (LAMICTAL) 100 MG tablet Take 100 mg by mouth daily.   Yes Historical Provider, MD  lithium carbonate 300 MG capsule Take 300 mg by mouth 3 (three) times daily with meals.   Yes Historical Provider, MD    Allergies  Allergen Reactions  . Aleve [Naproxen Sodium]   . Aspirin   . Ciprofloxacin Hcl   . Ibuprofen   . Macrobid [Nitrofurantoin Monohyd Macro]     Chills, tingling, and nerve pain in legs   . Penicillins   . Sulfa Antibiotics     History reviewed. No pertinent past surgical history.  Social History  Substance Use Topics  . Smoking status: Never Smoker   . Smokeless tobacco: None  . Alcohol Use: None    Family History  Problem Relation Age of Onset  . Hypertension Mother   . Cancer Maternal Grandmother   . Cancer Maternal Grandfather   . Hypertension Paternal Grandmother   . Cancer Paternal Grandfather     Medication list has been reviewed and updated.  Physical Examination:  Physical Exam  Constitutional: She is oriented to person, place, and time. She appears well-developed and well-nourished. No distress.  HENT:  Head: Normocephalic and atraumatic.  Right Ear: Hearing normal.  Left Ear: Hearing normal.  Nose: Nose normal.  Mouth/Throat: Uvula  is midline, oropharynx is clear and moist and mucous membranes are normal.  Eyes: Conjunctivae, EOM and lids are normal. Pupils are equal, round, and reactive to light. Right eye exhibits no discharge. Left eye exhibits no discharge. No scleral icterus.  Pulmonary/Chest: Effort normal. No respiratory distress.  Musculoskeletal: Normal range of motion.       Cervical back: She exhibits tenderness (right paraspinal). She exhibits normal range of motion and no bony tenderness.  Neurological: She is alert and oriented to person, place, and time. She has normal strength and normal reflexes. No cranial  nerve deficit or sensory deficit. Gait normal.  Skin: Skin is warm, dry and intact. No lesion and no rash noted.  Psychiatric: She has a normal mood and affect. Her speech is normal and behavior is normal. Thought content normal.   BP 130/70 mmHg  Pulse 72  Temp(Src) 98.6 F (37 C) (Oral)  Resp 16  Ht 5' 9.25" (1.759 m)  Wt 131 lb 6.4 oz (59.603 kg)  BMI 19.26 kg/m2  SpO2 100%  Assessment and Plan:  1. Panic attack Pt having panic attacks during stressful time before graduation from college. Only thing that has worked for her in the past was gabapentin. She has been very sensitive to sedation caused by benzos. We decided to try hydroxyzine -- she could take if she has a panic attack or even as a preventative prior to sleep. If continues to have panic attacks after graduation, follow up with Dr. Frankey Poot to discuss restarting gabapentin. - hydrOXYzine (VISTARIL) 25 MG capsule; Take 1 capsule po every 8 hours as needed for anxiety.  Dispense: 30 capsule; Refill: 0  2. Neck strain, initial encounter Neuro exam normal, tender right cervical paraspinal. Continue with tylenol and heat. If not getting better in 1 week or if at any time symptoms worsen, rtc. Return precautions discussed.   [place]well Min[place] Dyke Brackett, MHS [facility]  07/01/2015

## 2015-12-08 ENCOUNTER — Ambulatory Visit (INDEPENDENT_AMBULATORY_CARE_PROVIDER_SITE_OTHER): Payer: Medicare Other | Admitting: Physician Assistant

## 2015-12-08 VITALS — BP 103/67 | HR 76 | Temp 98.5°F | Resp 16 | Ht 69.0 in | Wt 133.4 lb

## 2015-12-08 DIAGNOSIS — R300 Dysuria: Secondary | ICD-10-CM

## 2015-12-08 LAB — POCT URINALYSIS DIP (MANUAL ENTRY)
Bilirubin, UA: NEGATIVE
Glucose, UA: NEGATIVE
Ketones, POC UA: NEGATIVE
Nitrite, UA: NEGATIVE
Protein Ur, POC: 100 — AB
Spec Grav, UA: 1.01
Urobilinogen, UA: 0.2
pH, UA: 7

## 2015-12-08 LAB — POC MICROSCOPIC URINALYSIS (UMFC): MUCUS RE: ABSENT

## 2015-12-08 LAB — POCT URINE PREGNANCY: PREG TEST UR: NEGATIVE

## 2015-12-08 NOTE — Progress Notes (Signed)
Amber Schultz  MRN: [id number] DOB: 1986/02/13  PCP: No primary care provider on file.  Subjective:  Amber Schultz is a 30 y.o. female who complains of mild burning with urination for 1 day. Her symptom started this morning and she notes the burning is at the end of her stream.  Denies urinary frequency, urgency, dysuria, flank pain, fever, chills, or abnormal vaginal discharge, itching or bleeding.  Notes possible missed period.  Two weeks ago she was sick with diarrhea. Feeling better for two days. She has been "stressed out" recently because she thinks her dog may have cancer.  She drinks a cup of water a day.   Last year, had UTI treaded Cipro. Her UTI came back three weeks later.  Lithium use - follows up with provider twice a year for lab draws.   Review of Systems  Constitutional: Negative for chills, fatigue and fever.  Cardiovascular: Negative for chest pain and palpitations.  Gastrointestinal: Negative for diarrhea, nausea and vomiting.  Genitourinary: Positive for dysuria. Negative for decreased urine volume, difficulty urinating, enuresis, flank pain, hematuria, pelvic pain and urgency.    Patient Active Problem List   Diagnosis Date Noted  . Dysuria 06/08/2015  . Anxiety 05/18/2015  . Bipolar I disorder (HCC) 10/10/2012    Current Outpatient Prescriptions on File Prior to Visit  Medication Sig Dispense Refill  . drospirenone-ethinyl estradiol (YAZ,GIANVI,LORYNA) 3-0.02 MG tablet Take 1 tablet by mouth daily.    Marland Kitchen lamoTRIgine (LAMICTAL) 100 MG tablet Take 100 mg by mouth daily.    Marland Kitchen lithium carbonate 300 MG capsule Take 300 mg by mouth 3 (three) times daily with meals.    . hydrOXYzine (VISTARIL) 25 MG capsule Take 1 capsule po every 8 hours as needed for anxiety. (Patient not taking: Reported on 12/08/2015) 30 capsule 0   No current facility-administered medications on file prior to visit.     Allergies  Allergen Reactions  . Aleve [Naproxen Sodium]     . Aspirin   . Ciprofloxacin Hcl   . Ibuprofen   . Macrobid [Nitrofurantoin Monohyd Macro]     Chills, tingling, and nerve pain in legs   . Penicillins   . Sulfa Antibiotics     Objective:  BP 103/67 (BP Location: Right Arm, Patient Position: Sitting, Cuff Size: Normal)   Pulse 76   Temp 98.5 F (36.9 C) (Oral)   Resp 16   Ht 5\' 9"  (1.753 m)   Wt 133 lb 6.4 oz (60.5 kg)   SpO2 100%   BMI 19.70 kg/m   Physical Exam  Constitutional: She is oriented to person, place, and time and well-developed, well-nourished, and in no distress. No distress.  Cardiovascular: Normal rate, regular rhythm and normal heart sounds.   Pulmonary/Chest: Effort normal. No respiratory distress.  Abdominal: There is no tenderness. There is no CVA tenderness.  Neurological: She is alert and oriented to person, place, and time. GCS score is 15.  Skin: Skin is warm and dry.  Psychiatric: Mood, memory, affect and judgment normal.  Vitals reviewed.  Results for orders placed or performed in visit on 12/08/15  POCT urine pregnancy  Result Value Ref Range   Preg Test, Ur Negative Negative  POCT Microscopic Urinalysis ([facility])  Result Value Ref Range   WBC,UR,HPF,POC Too numerous to count  (A) None WBC/hpf   RBC,UR,HPF,POC None None RBC/hpf   Bacteria None None, Too numerous to count   Mucus Absent Absent   Epithelial Cells, UR Per Microscopy Few (  A) None, Too numerous to count cells/hpf  POCT urinalysis dipstick  Result Value Ref Range   Color, UA yellow yellow   Clarity, UA clear clear   Glucose, UA negative negative   Bilirubin, UA negative negative   Ketones, POC UA negative negative   Spec Grav, UA 1.010    Blood, UA large (A) negative   pH, UA 7.0    Protein Ur, POC =100 (A) negative   Urobilinogen, UA 0.2    Nitrite, UA Negative Negative   Leukocytes, UA moderate (2+) (A) Negative    Assessment and Plan :  1. Dysuria 2. Burning with urination - POCT urine pregnancy - POCT Microscopic  Urinalysis ([facility]) - Urine culture - POCT urinalysis dipstick - Supportive care: Discussed at length with patient importance of staying well hydrated. She understands and agrees. Consider referral to urologist if she continues to develop symptoms of UTI multiple times in a year. This is the third time she reports these symptoms this year.    Marco CollieWhitney Anas Reister, PA-C  [facility] 12/08/2015 4:33 PM

## 2015-12-08 NOTE — Patient Instructions (Signed)
     IF you received an x-ray today, you will receive an invoice from [facility]. Please contact [facility] at [phone] with questions or concerns regarding your invoice.   IF you received labwork today, you will receive an invoice from [facility]. Please contact [facility] at [phone] with questions or concerns regarding your invoice.   Our billing staff will not be able to assist you with questions regarding bills from these companies.  You will be contacted with the lab results as soon as they are available. The fastest way to get your results is to activate your My Chart account. Instructions are located on the last page of this paperwork. If you have not heard from us regarding the results in 2 weeks, please contact this office.      

## 2015-12-09 LAB — URINE CULTURE

## 2015-12-11 ENCOUNTER — Encounter: Payer: Self-pay | Admitting: Physician Assistant

## 2016-03-02 ENCOUNTER — Ambulatory Visit (INDEPENDENT_AMBULATORY_CARE_PROVIDER_SITE_OTHER): Payer: Medicare Other | Admitting: Emergency Medicine

## 2016-03-02 VITALS — BP 122/80 | HR 77 | Temp 98.4°F | Resp 18 | Ht 69.0 in | Wt 133.0 lb

## 2016-03-02 DIAGNOSIS — J069 Acute upper respiratory infection, unspecified: Secondary | ICD-10-CM | POA: Diagnosis not present

## 2016-03-02 NOTE — Patient Instructions (Addendum)
     IF you received an x-ray today, you will receive an invoice from [facility]. Please contact [facility] at [phone] with questions or concerns regarding your invoice.   IF you received labwork today, you will receive an invoice from [facility]. Please contact [facility] at [phone] with questions or concerns regarding your invoice.   Our billing staff will not be able to assist you with questions regarding bills from these companies.  You will be contacted with the lab results as soon as they are available. The fastest way to get your results is to activate your My Chart account. Instructions are located on the last page of this paperwork. If you have not heard from us regarding the results in 2 weeks, please contact this office.      Upper Respiratory Infection, Adult Most upper respiratory infections (URIs) are caused by a virus. A URI affects the nose, throat, and upper air passages. The most common type of URI is often called "the common cold." Follow these instructions at home:  Take medicines only as told by your doctor.  Gargle warm saltwater or take cough drops to comfort your throat as told by your doctor.  Use a warm mist humidifier or inhale steam from a shower to increase air moisture. This may make it easier to breathe.  Drink enough fluid to keep your pee (urine) clear or pale yellow.  Eat soups and other clear broths.  Have a healthy diet.  Rest as needed.  Go back to work when your fever is gone or your doctor says it is okay.  You may need to stay home longer to avoid giving your URI to others.  You can also wear a face mask and wash your hands often to prevent spread of the virus.  Use your inhaler more if you have asthma.  Do not use any tobacco products, including cigarettes, chewing tobacco, or electronic cigarettes. If you need help quitting, ask your doctor. Contact a doctor if:  You are getting worse, not better.  Your  symptoms are not helped by medicine.  You have chills.  You are getting more short of breath.  You have brown or red mucus.  You have yellow or brown discharge from your nose.  You have pain in your face, especially when you bend forward.  You have a fever.  You have puffy (swollen) neck glands.  You have pain while swallowing.  You have white areas in the back of your throat. Get help right away if:  You have very bad or constant:  Headache.  Ear pain.  Pain in your forehead, behind your eyes, and over your cheekbones (sinus pain).  Chest pain.  You have long-lasting (chronic) lung disease and any of the following:  Wheezing.  Long-lasting cough.  Coughing up blood.  A change in your usual mucus.  You have a stiff neck.  You have changes in your:  Vision.  Hearing.  Thinking.  Mood. This information is not intended to replace advice given to you by your health care provider. Make sure you discuss any questions you have with your health care provider. Document Released: 08/02/2007 Document Revised: 10/17/2015 Document Reviewed: 05/21/2013 Elsevier Interactive Patient Education  2017 Elsevier Inc.  

## 2016-03-02 NOTE — Progress Notes (Signed)
Amber Schultz 31 y.o.   Chief Complaint  Patient presents with  . Influenza    possible x 5 days   . Chest Pain    congestion   . Fever    HISTORY OF PRESENT ILLNESS: This is a 31 y.o. female complaining of flu-like symptoms for 5 days.  URI   This is a new problem. The current episode started in the past 7 days. The problem has been waxing and waning. The maximum temperature recorded prior to her arrival was 101 - 101.9 F. The fever has been present for 1 to 2 days. Associated symptoms include chest pain (when coughing), congestion and coughing. Pertinent negatives include no abdominal pain, diarrhea, dysuria, ear pain, headaches, joint pain, joint swelling, nausea, neck pain, rash, rhinorrhea, sinus pain, sore throat, swollen glands, vomiting or wheezing. She has tried acetaminophen for the symptoms. The treatment provided mild relief.     Prior to Admission medications   Medication Sig Start Date End Date Taking? Authorizing Provider  drospirenone-ethinyl estradiol (YAZ,GIANVI,LORYNA) 3-0.02 MG tablet Take 1 tablet by mouth daily.   Yes Historical Provider, MD  lamoTRIgine (LAMICTAL) 100 MG tablet Take 100 mg by mouth daily.   Yes Historical Provider, MD  lithium carbonate 300 MG capsule Take 300 mg by mouth 3 (three) times daily with meals.   Yes Historical Provider, MD  hydrOXYzine (VISTARIL) 25 MG capsule Take 1 capsule po every 8 hours as needed for anxiety. Patient not taking: Reported on 03/02/2016 07/01/15   Dorna Leitz, PA-C    Allergies  Allergen Reactions  . Aleve [Naproxen Sodium]   . Aspirin   . Ciprofloxacin Hcl   . Ibuprofen   . Macrobid [Nitrofurantoin Monohyd Macro]     Chills, tingling, and nerve pain in legs   . Penicillins   . Sulfa Antibiotics     Patient Active Problem List   Diagnosis Date Noted  . Dysuria 06/08/2015  . Anxiety 05/18/2015  . Bipolar I disorder (HCC) 10/10/2012    Past Medical History:  Diagnosis Date  . Allergy   .  Anxiety   . Bipolar 1 disorder (HCC)   . Depression   . Goiter     History reviewed. No pertinent surgical history.  Social History   Social History  . Marital status: Single    Spouse name: N/A  . Number of children: N/A  . Years of education: N/A   Occupational History  . Not on file.   Social History Main Topics  . Smoking status: Never Smoker  . Smokeless tobacco: Never Used  . Alcohol use No  . Drug use: No  . Sexual activity: Not on file   Other Topics Concern  . Not on file   Social History Narrative  . No narrative on file    Family History  Problem Relation Age of Onset  . Hypertension Mother   . Cancer Maternal Grandmother   . Cancer Maternal Grandfather   . Hypertension Paternal Grandmother   . Cancer Paternal Grandfather      Review of Systems  Constitutional: Positive for fever and malaise/fatigue. Negative for chills.  HENT: Positive for congestion. Negative for ear pain, rhinorrhea, sinus pain and sore throat.   Eyes: Negative.   Respiratory: Positive for cough. Negative for wheezing.   Cardiovascular: Positive for chest pain (when coughing).  Gastrointestinal: Negative for abdominal pain, diarrhea, nausea and vomiting.  Genitourinary: Negative for dysuria.  Musculoskeletal: Negative for joint pain and neck pain.  Skin: Negative for rash.  Neurological: Negative for dizziness and headaches.  Endo/Heme/Allergies: Negative.   Psychiatric/Behavioral: Negative.    Vitals:   03/02/16 1705  BP: 122/80  Pulse: 77  Resp: 18  Temp: 98.4 F (36.9 C)     Physical Exam  Constitutional: She is oriented to person, place, and time. She appears well-developed and well-nourished.  HENT:  Head: Normocephalic and atraumatic.  Right Ear: External ear normal.  Left Ear: External ear normal.  Nose: Nose normal.  Mouth/Throat: Oropharynx is clear and moist.  Eyes: Conjunctivae and EOM are normal. Pupils are equal, round, and reactive to light.    Neck: Normal range of motion. Neck supple.  Cardiovascular: Normal rate, regular rhythm, normal heart sounds and intact distal pulses.   Pulmonary/Chest: Effort normal and breath sounds normal.  Abdominal: Soft. Bowel sounds are normal. There is no tenderness.  Musculoskeletal: Normal range of motion.  Neurological: She is alert and oriented to person, place, and time.  Skin: Skin is warm and dry. Capillary refill takes less than 2 seconds.  Psychiatric: She has a normal mood and affect. Her behavior is normal.  Vitals reviewed.    ASSESSMENT & PLAN: Robinn was seen today for influenza, chest pain and fever.  Diagnoses and all orders for this visit:  Acute upper respiratory infection    Patient Instructions       IF you received an x-ray today, you will receive an invoice from [facility]. Please contact [facility] at [phone] with questions or concerns regarding your invoice.   IF you received labwork today, you will receive an invoice from [facility]. Please contact [facility] at [phone] with questions or concerns regarding your invoice.   Our billing staff will not be able to assist you with questions regarding bills from these companies.  You will be contacted with the lab results as soon as they are available. The fastest way to get your results is to activate your My Chart account. Instructions are located on the last page of this paperwork. If you have not heard from [place] regarding the results in 2 weeks, please contact this office.      Upper Respiratory Infection, Adult Most upper respiratory infections (URIs) are caused by a virus. A URI affects the nose, throat, and upper air passages. The most common type of URI is often called "the common cold." Follow these instructions at home:  Take medicines only as told by your doctor.  Gargle warm saltwater or take cough drops to comfort your throat as told by your doctor.  Use a warm mist  humidifier or inhale steam from a shower to increase air moisture. This may make it easier to breathe.  Drink enough fluid to keep your pee (urine) clear or pale yellow.  Eat soups and other clear broths.  Have a healthy diet.  Rest as needed.  Go back to work when your fever is gone or your doctor says it is okay.  You may need to stay home longer to avoid giving your URI to others.  You can also wear a face mask and wash your hands often to prevent spread of the virus.  Use your inhaler more if you have asthma.  Do not use any tobacco products, including cigarettes, chewing tobacco, or electronic cigarettes. If you need help quitting, ask your doctor. Contact a doctor if:  You are getting worse, not better.  Your symptoms are not helped by medicine.  You have chills.  You are getting  more short of breath.  You have brown or red mucus.  You have yellow or brown discharge from your nose.  You have pain in your face, especially when you bend forward.  You have a fever.  You have puffy (swollen) neck glands.  You have pain while swallowing.  You have white areas in the back of your throat. Get help right away if:  You have very bad or constant:  Headache.  Ear pain.  Pain in your forehead, behind your eyes, and over your cheekbones (sinus pain).  Chest pain.  You have long-lasting (chronic) lung disease and any of the following:  Wheezing.  Long-lasting cough.  Coughing up blood.  A change in your usual mucus.  You have a stiff neck.  You have changes in your:  Vision.  Hearing.  Thinking.  Mood. This information is not intended to replace advice given to you by your health care provider. Make sure you discuss any questions you have with your health care provider. Document Released: 08/02/2007 Document Revised: 10/17/2015 Document Reviewed: 05/21/2013 Elsevier Interactive Patient Education  2017 Elsevier Inc.      Edwina BarthMiguel Ronnae Kaser,  MD [facility]

## 2016-03-21 ENCOUNTER — Ambulatory Visit (INDEPENDENT_AMBULATORY_CARE_PROVIDER_SITE_OTHER): Payer: Medicare Other | Admitting: Physician Assistant

## 2016-03-21 VITALS — BP 120/86 | HR 73 | Temp 98.2°F | Resp 16 | Ht 69.0 in | Wt 134.0 lb

## 2016-03-21 DIAGNOSIS — R0981 Nasal congestion: Secondary | ICD-10-CM

## 2016-03-21 DIAGNOSIS — R05 Cough: Secondary | ICD-10-CM | POA: Diagnosis not present

## 2016-03-21 DIAGNOSIS — R059 Cough, unspecified: Secondary | ICD-10-CM

## 2016-03-21 DIAGNOSIS — R058 Other specified cough: Secondary | ICD-10-CM

## 2016-03-21 MED ORDER — FLUTICASONE PROPIONATE 50 MCG/ACT NA SUSP: 2.0000 | Freq: Every day | NASAL | 6 refills | Status: DC

## 2016-03-21 MED ORDER — BENZONATATE 100 MG PO CAPS: 100.0000 mg | ORAL_CAPSULE | Freq: Three times a day (TID) | ORAL | 0 refills | Status: DC | PRN

## 2016-03-21 MED ORDER — PREDNISONE 10 MG PO TABS: ORAL_TABLET | ORAL | 0 refills | Status: DC

## 2016-03-21 NOTE — Patient Instructions (Addendum)
Please stay well hydrated - drink at least 2 liters of water/day.  Flonase - 2 sprays each nostril at night and in the morning until your symptom free Tessalon is for cough. Take this as directed and as needed Prednisone will help your cough and congestion and inflammation. Take this as directed.  Come back if you are not better.   Thank you for coming in today. I hope you feel we met your needs.  Feel free to call [facility] if you have any questions or further requests.  Please consider signing up for MyChart if you do not already have it, as this is a great way to communicate with me.  Best,  Whitney McVey, PA-C  IF you received an x-ray today, you will receive an invoice from [facility]. Please contact [facility] at [phone] with questions or concerns regarding your invoice.   IF you received labwork today, you will receive an invoice from [facility]. Please contact [facility] at [phone] with questions or concerns regarding your invoice.   Our billing staff will not be able to assist you with questions regarding bills from these companies.  You will be contacted with the lab results as soon as they are available. The fastest way to get your results is to activate your My Chart account. Instructions are located on the last page of this paperwork. If you have not heard from [place] regarding the results in 2 weeks, please contact this office.

## 2016-03-21 NOTE — Progress Notes (Signed)
Amber Schultz  MRN: [id number] DOB: [birth year]  PCP: No PCP Per Patient  Subjective:  Pt is a 31 year old female who presents to clinic for f/u cough. She was here 03/02/2016 for acute respiratory infection. At that time she had  +fever, chest pain, congestion, cough.  Today c/o Cough still present. Productive cough. +chest tightness. Worse in morning. "more annoying than anything". She cannot breathe through her nose and this is also an annoyance. Congestion is worse in the morning.  Denies wheezing, SOB, sore throat, sneezing, chills, fever, chest pain, palpitations.   Review of Systems  Constitutional: Negative for chills, diaphoresis, fatigue and fever.  HENT: Positive for congestion. Negative for postnasal drip, rhinorrhea, sinus pressure, sneezing and sore throat.   Respiratory: Positive for cough. Negative for chest tightness, shortness of breath and wheezing.   Cardiovascular: Negative for chest pain and palpitations.  Gastrointestinal: Negative for abdominal pain, diarrhea, nausea and vomiting.  Neurological: Negative for weakness, light-headedness and headaches.    Patient Active Problem List   Diagnosis Date Noted  . Acute upper respiratory infection 03/02/2016  . Anxiety 05/18/2015  . Bipolar I disorder (HCC) 10/10/2012    Current Outpatient Prescriptions on File Prior to Visit  Medication Sig Dispense Refill  . drospirenone-ethinyl estradiol (YAZ,GIANVI,LORYNA) 3-0.02 MG tablet Take 1 tablet by mouth daily.    Marland Kitchen lamoTRIgine (LAMICTAL) 100 MG tablet Take 100 mg by mouth daily.    Marland Kitchen lithium carbonate 300 MG capsule Take 300 mg by mouth 3 (three) times daily with meals.     No current facility-administered medications on file prior to visit.     Allergies  Allergen Reactions  . Aleve [Naproxen Sodium]   . Aspirin   . Ciprofloxacin Hcl   . Ibuprofen   . Macrobid [Nitrofurantoin Monohyd Macro]     Chills, tingling, and nerve pain in legs   . Penicillins   .  Sulfa Antibiotics      Objective:  BP 120/86   Pulse 73   Temp 98.2 F (36.8 C) (Oral)   Resp 16   Ht 5\' 9"  (1.753 m)   Wt 134 lb (60.8 kg)   SpO2 99%   BMI 19.79 kg/m   Physical Exam  Constitutional: She is oriented to person, place, and time and well-developed, well-nourished, and in no distress. No distress.  HENT:  Right Ear: Tympanic membrane normal.  Left Ear: Tympanic membrane normal.  Nose: Mucosal edema and rhinorrhea present. Right sinus exhibits no maxillary sinus tenderness and no frontal sinus tenderness. Left sinus exhibits no maxillary sinus tenderness and no frontal sinus tenderness.  Mouth/Throat: Mucous membranes are normal. Posterior oropharyngeal edema present. No oropharyngeal exudate or posterior oropharyngeal erythema.  Cardiovascular: Normal rate, regular rhythm and normal heart sounds.   Pulmonary/Chest: Effort normal and breath sounds normal. She has no wheezes. She has no rhonchi. She has no rales.  Abdominal: Soft. There is no tenderness.  Neurological: She is alert and oriented to person, place, and time. GCS score is 15.  Skin: Skin is warm and dry.  Psychiatric: Mood, memory, affect and judgment normal.  Vitals reviewed.   Assessment and Plan :  1. Upper airway cough syndrome 2. Cough 3. Nasal congestion - fluticasone (FLONASE) 50 MCG/ACT nasal spray; Place 2 sprays into both nostrils daily.  Dispense: 16 g; Refill: 6 - predniSONE (DELTASONE) 10 MG tablet; Take 3 PO QAM x2days, 2 PO QAM x2days, 1 PO QAM x2days  Dispense: 12 tablet; Refill: 0 -  benzonatate (TESSALON) 100 MG capsule; Take 1-2 capsules (100-200 mg total) by mouth 3 (three) times daily as needed for cough.  Dispense: 40 capsule; Refill: 0 - Supportive care. RTC in 5-7 days if no improvement.   Marco CollieWhitney Nylene Inlow, PA-C  [facility] 03/21/2016 4:01 PM

## 2016-09-20 DIAGNOSIS — Z0271 Encounter for disability determination: Secondary | ICD-10-CM

## 2017-08-02 ENCOUNTER — Encounter: Payer: Self-pay | Admitting: Emergency Medicine

## 2017-08-02 ENCOUNTER — Ambulatory Visit: Payer: Medicare Other | Admitting: Emergency Medicine

## 2017-08-02 ENCOUNTER — Other Ambulatory Visit: Payer: Self-pay

## 2017-08-02 VITALS — BP 114/70 | HR 83 | Temp 98.2°F | Resp 16 | Ht 69.0 in | Wt 139.6 lb

## 2017-08-02 DIAGNOSIS — M542 Cervicalgia: Secondary | ICD-10-CM | POA: Diagnosis not present

## 2017-08-02 DIAGNOSIS — M7918 Myalgia, other site: Secondary | ICD-10-CM

## 2017-08-02 DIAGNOSIS — M62838 Other muscle spasm: Secondary | ICD-10-CM

## 2017-08-02 NOTE — Progress Notes (Signed)
Amber Schultz 32 y.o.   Chief Complaint  Patient presents with  . Neck Pain    RIGHT of neck and the center forehead x 4 days    HISTORY OF PRESENT ILLNESS: This is a 32 y.o. female complaining of left-sided neck pain started 4 days ago.  Denies injury.  Pain is sharp and constant with some radiation up into left forehead and left periorbital area.  No associated symptoms.  Has a history of migraine headaches and bipolar disorder.  HPI   Prior to Admission medications   Medication Sig Start Date End Date Taking? Authorizing Provider  drospirenone-ethinyl estradiol (YAZ,GIANVI,LORYNA) 3-0.02 MG tablet Take 1 tablet by mouth daily.   Yes [provider]  fluticasone (FLONASE) 50 MCG/ACT nasal spray Place 2 sprays into both nostrils daily. 03/21/16  Yes McVey, Madelaine Bhat, PA-C  lithium carbonate 300 MG capsule Take 300 mg by mouth 3 (three) times daily with meals.   Yes [provider]  lamoTRIgine (LAMICTAL) 100 MG tablet Take 100 mg by mouth daily.    [provider]  predniSONE (DELTASONE) 10 MG tablet Take 3 PO QAM x2days, 2 PO QAM x2days, 1 PO QAM x2days Patient not taking: Reported on 08/02/2017 03/21/16   McVey, Madelaine Bhat, PA-C    Allergies  Allergen Reactions  . Aleve [Naproxen Sodium]   . Aspirin   . Ciprofloxacin Hcl   . Ibuprofen   . Macrobid [Nitrofurantoin Monohyd Macro]     Chills, tingling, and nerve pain in legs   . Penicillins   . Sudafed [Pseudoephedrine Hcl]   . Sulfa Antibiotics     Patient Active Problem List   Diagnosis Date Noted  . Acute upper respiratory infection 03/02/2016  . Anxiety 05/18/2015  . Bipolar I disorder (HCC) 10/10/2012    Past Medical History:  Diagnosis Date  . Allergy   . Anxiety   . Bipolar 1 disorder (HCC)   . Depression   . Goiter     No past surgical history on file.  Social History   Socioeconomic History  . Marital status: Single    Spouse name: Not on file  . Number  of children: Not on file  . Years of education: Not on file  . Highest education level: Not on file  Occupational History  . Not on file  Social Needs  . Financial resource strain: Not on file  . Food insecurity:    Worry: Not on file    Inability: Not on file  . Transportation needs:    Medical: Not on file    Non-medical: Not on file  Tobacco Use  . Smoking status: Never Smoker  . Smokeless tobacco: Never Used  Substance and Sexual Activity  . Alcohol use: No    Alcohol/week: 0.0 oz  . Drug use: No  . Sexual activity: Not on file  Lifestyle  . Physical activity:    Days per week: Not on file    Minutes per session: Not on file  . Stress: Not on file  Relationships  . Social connections:    Talks on phone: Not on file    Gets together: Not on file    Attends religious service: Not on file    Active member of club or organization: Not on file    Attends meetings of clubs or organizations: Not on file    Relationship status: Not on file  . Intimate partner violence:    Fear of current or ex partner: Not  on file    Emotionally abused: Not on file    Physically abused: Not on file    Forced sexual activity: Not on file  Other Topics Concern  . Not on file  Social History Narrative  . Not on file    Family History  Problem Relation Age of Onset  . Hypertension Mother   . Cancer Maternal Grandmother   . Cancer Maternal Grandfather   . Hypertension Paternal Grandmother   . Cancer Paternal Grandfather      Review of Systems  Constitutional: Negative.  Negative for fever.  HENT: Negative.  Negative for congestion, nosebleeds and sore throat.   Eyes: Negative.  Negative for blurred vision and double vision.  Respiratory: Negative.  Negative for cough and shortness of breath.   Cardiovascular: Negative.  Negative for chest pain and palpitations.  Gastrointestinal: Negative.  Negative for abdominal pain, diarrhea, nausea and vomiting.  Genitourinary: Negative.     Musculoskeletal: Positive for neck pain.  Skin: Negative.  Negative for rash.  Neurological: Positive for headaches.  Endo/Heme/Allergies: Negative.   All other systems reviewed and are negative.   Vitals:   08/02/17 1125  BP: 114/70  Pulse: 83  Resp: 16  Temp: 98.2 F (36.8 C)  SpO2: 98%    Physical Exam  Constitutional: She is oriented to person, place, and time. She appears well-developed and well-nourished.  HENT:  Head: Normocephalic and atraumatic.  Right Ear: External ear normal.  Left Ear: External ear normal.  Nose: Nose normal.  Mouth/Throat: Oropharynx is clear and moist.  Eyes: Pupils are equal, round, and reactive to light. Conjunctivae and EOM are normal.  Neck: Trachea normal. Carotid bruit is not present. No Brudzinski's sign and no Kernig's sign noted.    Cardiovascular: Normal rate, regular rhythm and normal heart sounds.  Pulmonary/Chest: Effort normal and breath sounds normal.  Neurological: She is alert and oriented to person, place, and time. No cranial nerve deficit or sensory deficit. She exhibits normal muscle tone. Coordination normal.  Skin: Skin is warm and dry. Capillary refill takes less than 2 seconds. No rash noted.  Psychiatric: She has a normal mood and affect. Her behavior is normal.  Vitals reviewed.   A total of 25 minutes was spent in the room with the patient, greater than 50% of which was in counseling/coordination of care regarding differential diagnosis, management, medications, and need for follow-up if no better or worse.  ASSESSMENT & PLAN: Tamora was seen today for neck pain.  Diagnoses and all orders for this visit:  Neck pain  Musculoskeletal pain  Muscle spasm    Patient Instructions       IF you received an x-ray today, you will receive an invoice from [facility]. Please contact [facility] at [phone] with questions or concerns regarding your invoice.   IF you received labwork  today, you will receive an invoice from [place]. Please contact [facility] at [phone] with questions or concerns regarding your invoice.   Our billing staff will not be able to assist you with questions regarding bills from these companies.  You will be contacted with the lab results as soon as they are available. The fastest way to get your results is to activate your My Chart account. Instructions are located on the last page of this paperwork. If you have not heard from [place] regarding the results in 2 weeks, please contact this office.     Cervical Sprain A cervical sprain is a stretch or tear in  the tissues that connect bones (ligaments) in the neck. Most neck (cervical) sprains get better in 4-6 weeks. Follow these instructions at home: If you have a neck collar:  Wear it as told by your doctor. Do not take off (do not remove) the collar unless your doctor says that this is safe.  Ask your doctor before adjusting your collar.  If you have long hair, keep it outside of the collar.  Ask your doctor if you may take off the collar for cleaning and bathing. If you may take off the collar: ? Follow instructions from your doctor about how to take off the collar safely. ? Clean the collar by wiping it with mild soap and water. Let it air-dry all the way. ? If your collar has removable pads:  Take the pads out every 1-2 days.  Hand wash the pads with soap and water.  Let the pads air-dry all the way before you put them back in the collar. Do not dry them in a clothes dryer. Do not dry them with a hair dryer. ? Check your skin under the collar for irritation or sores. If you see any, tell your doctor. Managing pain, stiffness, and swelling  Use a cervical traction device, if told by your doctor.  If told, put heat on the affected area. Do this before exercises (physical therapy) or as often as told by your doctor. Use the heat source that your doctor recommends, such as a moist heat  pack or a heating pad. ? Place a towel between your skin and the heat source. ? Leave the heat on for 20-30 minutes. ? Take the heat off (remove the heat) if your skin turns bright red. This is very important if you cannot feel pain, heat, or cold. You may have a greater risk of getting burned.  Put ice on the affected area. ? Put ice in a plastic bag. ? Place a towel between your skin and the bag. ? Leave the ice on for 20 minutes, 2-3 times a day. Activity  Do not drive while wearing a neck collar. If you do not have a neck collar, ask your doctor if it is safe to drive.  Do not drive or use heavy machinery while taking prescription pain medicine or muscle relaxants, unless your doctor approves.  Do not lift anything that is heavier than 10 lb (4.5 kg) until your doctor tells you that it is safe.  Rest as told by your doctor.  Avoid activities that make you feel worse. Ask your doctor what activities are safe for you.  Do exercises as told by your doctor or physical therapist. Preventing neck sprain  Practice good posture. Adjust your workstation to help with this, if needed.  Exercise regularly as told by your doctor or physical therapist.  Avoid activities that are risky or may cause a neck sprain (cervical sprain). General instructions  Take over-the-counter and prescription medicines only as told by your doctor.  Do not use any products that contain nicotine or tobacco. This includes cigarettes and e-cigarettes. If you need help quitting, ask your doctor.  Keep all follow-up visits as told by your doctor. This is important. Contact a doctor if:  You have pain or other symptoms that get worse.  You have symptoms that do not get better after 2 weeks.  You have pain that does not get better with medicine.  You start to have new, unexplained symptoms.  You have sores or irritated skin from wearing  your neck collar. Get help right away if:  You have very bad  pain.  You have any of the following in any part of your body: ? Loss of feeling (numbness). ? Tingling. ? Weakness.  You cannot move a part of your body (you have paralysis).  Your activity level does not improve. Summary  A cervical sprain is a stretch or tear in the tissues that connect bones (ligaments) in the neck.  If you have a neck (cervical) collar, do not take off the collar unless your doctor says that this is safe.  Put ice on affected areas as told by your doctor.  Put heat on affected areas as told by your doctor.  Good posture and regular exercise can help prevent a neck sprain from happening again. This information is not intended to replace advice given to you by your health care provider. Make sure you discuss any questions you have with your health care provider. Document Released: 08/02/2007 Document Revised: 10/26/2015 Document Reviewed: 10/26/2015 Elsevier Interactive Patient Education  2017 [employer]vier [employer].      Edwina Barth, MD [facility]

## 2017-08-02 NOTE — Patient Instructions (Addendum)
   IF you received an x-ray today, you will receive an invoice from [facility]. Please contact [facility] at [phone] with questions or concerns regarding your invoice.   IF you received labwork today, you will receive an invoice from [facility]. Please contact [facility] at [phone] with questions or concerns regarding your invoice.   Our billing staff will not be able to assist you with questions regarding bills from these companies.  You will be contacted with the lab results as soon as they are available. The fastest way to get your results is to activate your My Chart account. Instructions are located on the last page of this paperwork. If you have not heard from us regarding the results in 2 weeks, please contact this office.      Cervical Sprain A cervical sprain is a stretch or tear in the tissues that connect bones (ligaments) in the neck. Most neck (cervical) sprains get better in 4-6 weeks. Follow these instructions at home: If you have a neck collar:  Wear it as told by your doctor. Do not take off (do not remove) the collar unless your doctor says that this is safe.  Ask your doctor before adjusting your collar.  If you have long hair, keep it outside of the collar.  Ask your doctor if you may take off the collar for cleaning and bathing. If you may take off the collar: ? Follow instructions from your doctor about how to take off the collar safely. ? Clean the collar by wiping it with mild soap and water. Let it air-dry all the way. ? If your collar has removable pads:  Take the pads out every 1-2 days.  Hand wash the pads with soap and water.  Let the pads air-dry all the way before you put them back in the collar. Do not dry them in a clothes dryer. Do not dry them with a hair dryer. ? Check your skin under the collar for irritation or sores. If you see any, tell your doctor. Managing pain, stiffness, and swelling  Use a cervical traction  device, if told by your doctor.  If told, put heat on the affected area. Do this before exercises (physical therapy) or as often as told by your doctor. Use the heat source that your doctor recommends, such as a moist heat pack or a heating pad. ? Place a towel between your skin and the heat source. ? Leave the heat on for 20-30 minutes. ? Take the heat off (remove the heat) if your skin turns bright red. This is very important if you cannot feel pain, heat, or cold. You may have a greater risk of getting burned.  Put ice on the affected area. ? Put ice in a plastic bag. ? Place a towel between your skin and the bag. ? Leave the ice on for 20 minutes, 2-3 times a day. Activity  Do not drive while wearing a neck collar. If you do not have a neck collar, ask your doctor if it is safe to drive.  Do not drive or use heavy machinery while taking prescription pain medicine or muscle relaxants, unless your doctor approves.  Do not lift anything that is heavier than 10 lb (4.5 kg) until your doctor tells you that it is safe.  Rest as told by your doctor.  Avoid activities that make you feel worse. Ask your doctor what activities are safe for you.  Do exercises as told by your doctor or physical   therapist. Preventing neck sprain  Practice good posture. Adjust your workstation to help with this, if needed.  Exercise regularly as told by your doctor or physical therapist.  Avoid activities that are risky or may cause a neck sprain (cervical sprain). General instructions  Take over-the-counter and prescription medicines only as told by your doctor.  Do not use any products that contain nicotine or tobacco. This includes cigarettes and e-cigarettes. If you need help quitting, ask your doctor.  Keep all follow-up visits as told by your doctor. This is important. Contact a doctor if:  You have pain or other symptoms that get worse.  You have symptoms that do not get better after 2  weeks.  You have pain that does not get better with medicine.  You start to have new, unexplained symptoms.  You have sores or irritated skin from wearing your neck collar. Get help right away if:  You have very bad pain.  You have any of the following in any part of your body: ? Loss of feeling (numbness). ? Tingling. ? Weakness.  You cannot move a part of your body (you have paralysis).  Your activity level does not improve. Summary  A cervical sprain is a stretch or tear in the tissues that connect bones (ligaments) in the neck.  If you have a neck (cervical) collar, do not take off the collar unless your doctor says that this is safe.  Put ice on affected areas as told by your doctor.  Put heat on affected areas as told by your doctor.  Good posture and regular exercise can help prevent a neck sprain from happening again. This information is not intended to replace advice given to you by your health care provider. Make sure you discuss any questions you have with your health care provider. Document Released: 08/02/2007 Document Revised: 10/26/2015 Document Reviewed: 10/26/2015 Elsevier Interactive Patient Education  2017 [employer].  

## 2017-08-24 ENCOUNTER — Ambulatory Visit: Payer: Medicare Other | Admitting: Physician Assistant

## 2017-10-26 ENCOUNTER — Encounter: Payer: Medicare Other | Admitting: Emergency Medicine

## 2017-10-31 ENCOUNTER — Ambulatory Visit (INDEPENDENT_AMBULATORY_CARE_PROVIDER_SITE_OTHER): Payer: Medicare Other | Admitting: Emergency Medicine

## 2017-10-31 ENCOUNTER — Other Ambulatory Visit: Payer: Self-pay

## 2017-10-31 ENCOUNTER — Encounter: Payer: Self-pay | Admitting: Emergency Medicine

## 2017-10-31 VITALS — BP 115/75 | HR 79 | Temp 98.9°F | Resp 16 | Ht 69.0 in | Wt 139.0 lb

## 2017-10-31 DIAGNOSIS — Z0001 Encounter for general adult medical examination with abnormal findings: Secondary | ICD-10-CM

## 2017-10-31 DIAGNOSIS — Z0000 Encounter for general adult medical examination without abnormal findings: Secondary | ICD-10-CM

## 2017-10-31 DIAGNOSIS — E049 Nontoxic goiter, unspecified: Secondary | ICD-10-CM

## 2017-10-31 DIAGNOSIS — F319 Bipolar disorder, unspecified: Secondary | ICD-10-CM

## 2017-10-31 NOTE — Patient Instructions (Addendum)
If you have lab work done today you will be contacted with your lab results within the next 2 weeks.  If you have not heard from [place] then please contact us. The fastest way to get your results is to register for My Chart.   IF you received an x-ray today, you will receive an invoice from [facility]. Please contact [facility] at [phone] with questions or concerns regarding your invoice.   IF you received labwork today, you will receive an invoice from [place]. Please contact [facility] at [phone] with questions or concerns regarding your invoice.   Our billing staff will not be able to assist you with questions regarding bills from these companies.  You will be contacted with the lab results as soon as they are available. The fastest way to get your results is to activate your My Chart account. Instructions are located on the last page of this paperwork. If you have not heard from [place] regarding the results in 2 weeks, please contact this office.     Health Maintenance, Female Adopting a healthy lifestyle and getting preventive care can go a long way to promote health and wellness. Talk with your health care provider about what schedule of regular examinations is right for you. This is a good chance for you to check in with your provider about disease prevention and staying healthy. In between checkups, there are plenty of things you can do on your own. Experts have done a lot of research about which lifestyle changes and preventive measures are most likely to keep you healthy. Ask your health care provider for more information. Weight and diet Eat a healthy diet  Be sure to include plenty of vegetables, fruits, low-fat dairy products, and lean protein.  Do not eat a lot of foods high in solid fats, added sugars, or salt.  Get regular exercise. This is one of the most important things you can do for your health. ? Most adults should exercise for at least 150  minutes each week. The exercise should increase your heart rate and make you sweat (moderate-intensity exercise). ? Most adults should also do strengthening exercises at least twice a week. This is in addition to the moderate-intensity exercise.  Maintain a healthy weight  Body mass index (BMI) is a measurement that can be used to identify possible weight problems. It estimates body fat based on height and weight. Your health care provider can help determine your BMI and help you achieve or maintain a healthy weight.  For females 22 years of age and older: ? A BMI below 18.5 is considered underweight. ? A BMI of 18.5 to 24.9 is normal. ? A BMI of 25 to 29.9 is considered overweight. ? A BMI of 30 and above is considered obese.  Watch levels of cholesterol and blood lipids  You should start having your blood tested for lipids and cholesterol at 32 years of age, then have this test every 5 years.  You may need to have your cholesterol levels checked more often if: ? Your lipid or cholesterol levels are high. ? You are older than 32 years of age. ? You are at high risk for heart disease.  Cancer screening Lung Cancer  Lung cancer screening is recommended for adults 58-34 years old who are at high risk for lung cancer because of a history of smoking.  A yearly low-dose CT scan of the lungs is recommended for people who: ? Currently smoke. ? Have quit  within the past 15 years. ? Have at least a 30-pack-year history of smoking. A pack year is smoking an average of one pack of cigarettes a day for 1 year.  Yearly screening should continue until it has been 15 years since you quit.  Yearly screening should stop if you develop a health problem that would prevent you from having lung cancer treatment.  Breast Cancer  Practice breast self-awareness. This means understanding how your breasts normally appear and feel.  It also means doing regular breast self-exams. Let your health care  provider know about any changes, no matter how small.  If you are in your 20s or 30s, you should have a clinical breast exam (CBE) by a health care provider every 1-3 years as part of a regular health exam.  If you are 48 or older, have a CBE every year. Also consider having a breast X-ray (mammogram) every year.  If you have a family history of breast cancer, talk to your health care provider about genetic screening.  If you are at high risk for breast cancer, talk to your health care provider about having an MRI and a mammogram every year.  Breast cancer gene (BRCA) assessment is recommended for women who have family members with BRCA-related cancers. BRCA-related cancers include: ? Breast. ? Ovarian. ? Tubal. ? Peritoneal cancers.  Results of the assessment will determine the need for genetic counseling and BRCA1 and BRCA2 testing.  Cervical Cancer Your health care provider may recommend that you be screened regularly for cancer of the pelvic organs (ovaries, uterus, and vagina). This screening involves a pelvic examination, including checking for microscopic changes to the surface of your cervix (Pap test). You may be encouraged to have this screening done every 3 years, beginning at age 8.  For women ages 8-65, health care providers may recommend pelvic exams and Pap testing every 3 years, or they may recommend the Pap and pelvic exam, combined with testing for human papilloma virus (HPV), every 5 years. Some types of HPV increase your risk of cervical cancer. Testing for HPV may also be done on women of any age with unclear Pap test results.  Other health care providers may not recommend any screening for nonpregnant women who are considered low risk for pelvic cancer and who do not have symptoms. Ask your health care provider if a screening pelvic exam is right for you.  If you have had past treatment for cervical cancer or a condition that could lead to cancer, you need Pap tests  and screening for cancer for at least 20 years after your treatment. If Pap tests have been discontinued, your risk factors (such as having a new sexual partner) need to be reassessed to determine if screening should resume. Some women have medical problems that increase the chance of getting cervical cancer. In these cases, your health care provider may recommend more frequent screening and Pap tests.  Colorectal Cancer  This type of cancer can be detected and often prevented.  Routine colorectal cancer screening usually begins at 32 years of age and continues through 32 years of age.  Your health care provider may recommend screening at an earlier age if you have risk factors for colon cancer.  Your health care provider may also recommend using home test kits to check for hidden blood in the stool.  A small camera at the end of a tube can be used to examine your colon directly (sigmoidoscopy or colonoscopy). This is done to check  for the earliest forms of colorectal cancer.  Routine screening usually begins at age 75.  Direct examination of the colon should be repeated every 5-10 years through 31 years of age. However, you may need to be screened more often if early forms of precancerous polyps or small growths are found.  Skin Cancer  Check your skin from head to toe regularly.  Tell your health care provider about any new moles or changes in moles, especially if there is a change in a mole's shape or color.  Also tell your health care provider if you have a mole that is larger than the size of a pencil eraser.  Always use sunscreen. Apply sunscreen liberally and repeatedly throughout the day.  Protect yourself by wearing long sleeves, pants, a wide-brimmed hat, and sunglasses whenever you are outside.  Heart disease, diabetes, and high blood pressure  High blood pressure causes heart disease and increases the risk of stroke. High blood pressure is more likely to develop  in: ? People who have blood pressure in the high end of the normal range (130-139/85-89 mm Hg). ? People who are overweight or obese. ? People who are African American.  If you are 41-67 years of age, have your blood pressure checked every 3-5 years. If you are 32 years of age or older, have your blood pressure checked every year. You should have your blood pressure measured twice-once when you are at a hospital or clinic, and once when you are not at a hospital or clinic. Record the average of the two measurements. To check your blood pressure when you are not at a hospital or clinic, you can use: ? An automated blood pressure machine at a pharmacy. ? A home blood pressure monitor.  If you are between 44 years and 70 years old, ask your health care provider if you should take aspirin to prevent strokes.  Have regular diabetes screenings. This involves taking a blood sample to check your fasting blood sugar level. ? If you are at a normal weight and have a low risk for diabetes, have this test once every three years after 32 years of age. ? If you are overweight and have a high risk for diabetes, consider being tested at a younger age or more often. Preventing infection Hepatitis B  If you have a higher risk for hepatitis B, you should be screened for this virus. You are considered at high risk for hepatitis B if: ? You were born in a country where hepatitis B is common. Ask your health care provider which countries are considered high risk. ? Your parents were born in a high-risk country, and you have not been immunized against hepatitis B (hepatitis B vaccine). ? You have HIV or AIDS. ? You use needles to inject street drugs. ? You live with someone who has hepatitis B. ? You have had sex with someone who has hepatitis B. ? You get hemodialysis treatment. ? You take certain medicines for conditions, including cancer, organ transplantation, and autoimmune conditions.  Hepatitis C  Blood  testing is recommended for: ? Everyone born from [birth year] through 1965. ? Anyone with known risk factors for hepatitis C.  Sexually transmitted infections (STIs)  You should be screened for sexually transmitted infections (STIs) including gonorrhea and chlamydia if: ? You are sexually active and are younger than 32 years of age. ? You are older than 32 years of age and your health care provider tells you that you are at risk for  this type of infection. ? Your sexual activity has changed since you were last screened and you are at an increased risk for chlamydia or gonorrhea. Ask your health care provider if you are at risk.  If you do not have HIV, but are at risk, it may be recommended that you take a prescription medicine daily to prevent HIV infection. This is called pre-exposure prophylaxis (PrEP). You are considered at risk if: ? You are sexually active and do not regularly use condoms or know the HIV status of your partner(s). ? You take drugs by injection. ? You are sexually active with a partner who has HIV.  Talk with your health care provider about whether you are at high risk of being infected with HIV. If you choose to begin PrEP, you should first be tested for HIV. You should then be tested every 3 months for as long as you are taking PrEP. Pregnancy  If you are premenopausal and you may become pregnant, ask your health care provider about preconception counseling.  If you may become pregnant, take 400 to 800 micrograms (mcg) of folic acid every day.  If you want to prevent pregnancy, talk to your health care provider about birth control (contraception). Osteoporosis and menopause  Osteoporosis is a disease in which the bones lose minerals and strength with aging. This can result in serious bone fractures. Your risk for osteoporosis can be identified using a bone density scan.  If you are 68 years of age or older, or if you are at risk for osteoporosis and fractures, ask your  health care provider if you should be screened.  Ask your health care provider whether you should take a calcium or vitamin D supplement to lower your risk for osteoporosis.  Menopause may have certain physical symptoms and risks.  Hormone replacement therapy may reduce some of these symptoms and risks. Talk to your health care provider about whether hormone replacement therapy is right for you. Follow these instructions at home:  Schedule regular health, dental, and eye exams.  Stay current with your immunizations.  Do not use any tobacco products including cigarettes, chewing tobacco, or electronic cigarettes.  If you are pregnant, do not drink alcohol.  If you are breastfeeding, limit how much and how often you drink alcohol.  Limit alcohol intake to no more than 1 drink per day for nonpregnant women. One drink equals 12 ounces of beer, 5 ounces of wine, or 1 ounces of hard liquor.  Do not use street drugs.  Do not share needles.  Ask your health care provider for help if you need support or information about quitting drugs.  Tell your health care provider if you often feel depressed.  Tell your health care provider if you have ever been abused or do not feel safe at home. This information is not intended to replace advice given to you by your health care provider. Make sure you discuss any questions you have with your health care provider. Document Released: 08/29/2010 Document Revised: 07/22/2015 Document Reviewed: 11/17/2014 [employer] Interactive Patient Education  [employer].

## 2017-10-31 NOTE — Progress Notes (Signed)
Amber Schultz 32 y.o.   Chief Complaint  Patient presents with  . Annual Exam    HISTORY OF PRESENT ILLNESS: This is a 32 y.o. female here for annual exam.  Has no complaints or medical concerns.  Has a history of bipolar disorder, on lithium.  Also has a history of mild thyroid enlargement, with negative work-up in the past.  Asymptomatic.  Next psychiatric appointment next month at [facility]  HPI   Prior to Admission medications   Medication Sig Start Date End Date Taking? Authorizing Provider  drospirenone-ethinyl estradiol (YAZ,GIANVI,LORYNA) 3-0.02 MG tablet Take 1 tablet by mouth daily.   Yes [provider]  fluticasone (FLONASE) 50 MCG/ACT nasal spray Place 2 sprays into both nostrils daily. 03/21/16  Yes McVey, Gelene Mink, PA-C  lamoTRIgine (LAMICTAL) 100 MG tablet Take 100 mg by mouth daily.   Yes [provider]  lithium carbonate 300 MG capsule Take 300 mg by mouth 3 (three) times daily with meals.   Yes [provider]  predniSONE (DELTASONE) 10 MG tablet Take 3 PO QAM x2days, 2 PO QAM x2days, 1 PO QAM x2days Patient not taking: Reported on 08/02/2017 03/21/16   McVey, Gelene Mink, PA-C    Allergies  Allergen Reactions  . Aleve [Naproxen Sodium]   . Aspirin   . Ciprofloxacin Hcl   . Ibuprofen   . Macrobid [Nitrofurantoin Monohyd Macro]     Chills, tingling, and nerve pain in legs   . Penicillins   . Sudafed [Pseudoephedrine Hcl]   . Sulfa Antibiotics     Patient Active Problem List   Diagnosis Date Noted  . Neck pain 08/02/2017  . Musculoskeletal pain 08/02/2017  . Muscle spasm 08/02/2017  . Acute upper respiratory infection 03/02/2016  . Anxiety 05/18/2015  . Bipolar I disorder ([place]) 10/10/2012    Past Medical History:  Diagnosis Date  . Allergy   . Anxiety   . Bipolar 1 disorder ([place])   . Depression   . Goiter     No past surgical history on file.  Social History   Socioeconomic History  . Marital  status: Single    Spouse name: Not on file  . Number of children: Not on file  . Years of education: Not on file  . Highest education level: Not on file  Occupational History  . Not on file  Social Needs  . Financial resource strain: Not on file  . Food insecurity:    Worry: Not on file    Inability: Not on file  . Transportation needs:    Medical: Not on file    Non-medical: Not on file  Tobacco Use  . Smoking status: Never Smoker  . Smokeless tobacco: Never Used  Substance and Sexual Activity  . Alcohol use: No    Alcohol/week: 0.0 standard drinks  . Drug use: No  . Sexual activity: Not on file  Lifestyle  . Physical activity:    Days per week: Not on file    Minutes per session: Not on file  . Stress: Not on file  Relationships  . Social connections:    Talks on phone: Not on file    Gets together: Not on file    Attends religious service: Not on file    Active member of club or organization: Not on file    Attends meetings of clubs or organizations: Not on file    Relationship status: Not on file  . Intimate partner violence:    Fear of  current or ex partner: Not on file    Emotionally abused: Not on file    Physically abused: Not on file    Forced sexual activity: Not on file  Other Topics Concern  . Not on file  Social History Narrative  . Not on file    Family History  Problem Relation Age of Onset  . Hypertension Mother   . Cancer Mother        melanoma  . Cancer Father        prostate  . Bipolar disorder Father   . Cancer Maternal Grandmother   . Cancer Maternal Grandfather   . Hypertension Paternal Grandmother   . Cancer Paternal Grandfather      Review of Systems  Constitutional: Negative.  Negative for chills and fever.  HENT: Negative.  Negative for congestion, hearing loss, nosebleeds and sore throat.   Eyes: Negative.  Negative for blurred vision.  Respiratory: Negative.  Negative for cough and wheezing.   Cardiovascular: Negative.   Negative for chest pain and palpitations.  Gastrointestinal: Negative.  Negative for abdominal pain, blood in stool, diarrhea, melena, nausea and vomiting.  Genitourinary: Negative.  Negative for dysuria and hematuria.  Musculoskeletal: Negative for back pain, myalgias and neck pain.  Skin: Negative.  Negative for rash.  Neurological: Negative.  Negative for dizziness and headaches.  Endo/Heme/Allergies: Negative.   All other systems reviewed and are negative.   Vitals:   10/31/17 0847  BP: 115/75  Pulse: 79  Resp: 16  Temp: 98.9 F (37.2 C)  SpO2: 98%    Physical Exam  Constitutional: She is oriented to person, place, and time. She appears well-developed and well-nourished.  HENT:  Head: Normocephalic and atraumatic.  Nose: Nose normal.  Mouth/Throat: Oropharynx is clear and moist.  Eyes: Pupils are equal, round, and reactive to light. Conjunctivae and EOM are normal.  Neck: Normal range of motion. Neck supple. No JVD present. Thyromegaly (Mild diffuse enlargement.  No masses and no tenderness.) present.  Cardiovascular: Normal rate, regular rhythm, normal heart sounds and intact distal pulses.  Pulmonary/Chest: Effort normal and breath sounds normal.  Abdominal: Soft. Bowel sounds are normal. She exhibits no distension. There is no tenderness.  Musculoskeletal: Normal range of motion. She exhibits no edema or tenderness.  Lymphadenopathy:    She has no cervical adenopathy.  Neurological: She is alert and oriented to person, place, and time. No sensory deficit. She exhibits normal muscle tone.  Skin: Skin is warm and dry. Capillary refill takes less than 2 seconds.  Psychiatric: She has a normal mood and affect. Her behavior is normal.  Vitals reviewed.    ASSESSMENT & PLAN: Amber Schultz was seen today for annual exam.  Diagnoses and all orders for this visit:  Routine general medical examination at a health care facility -     Comprehensive metabolic panel -     CBC with  Differential  Enlargement of thyroid -     Thyroid Panel With TSH  Bipolar I disorder ([place]) -     Lithium level   Patient Instructions       If you have lab work done today you will be contacted with your lab results within the next 2 weeks.  If you have not heard from [place] then please contact us. The fastest way to get your results is to register for My Chart.   IF you received an x-ray today, you will receive an invoice from [facility]. Please contact [facility] at [phone]  with questions or concerns regarding your invoice.   IF you received labwork today, you will receive an invoice from [facility]. Please contact [facility] at [phone] with questions or concerns regarding your invoice.   Our billing staff will not be able to assist you with questions regarding bills from these companies.  You will be contacted with the lab results as soon as they are available. The fastest way to get your results is to activate your My Chart account. Instructions are located on the last page of this paperwork. If you have not heard from [place] regarding the results in 2 weeks, please contact this office.     Health Maintenance, Female Adopting a healthy lifestyle and getting preventive care can go a long way to promote health and wellness. Talk with your health care provider about what schedule of regular examinations is right for you. This is a good chance for you to check in with your provider about disease prevention and staying healthy. In between checkups, there are plenty of things you can do on your own. Experts have done a lot of research about which lifestyle changes and preventive measures are most likely to keep you healthy. Ask your health care provider for more information. Weight and diet Eat a healthy diet  Be sure to include plenty of vegetables, fruits, low-fat dairy products, and lean protein.  Do not eat a lot of foods high in solid fats, added sugars, or  salt.  Get regular exercise. This is one of the most important things you can do for your health. ? Most adults should exercise for at least 150 minutes each week. The exercise should increase your heart rate and make you sweat (moderate-intensity exercise). ? Most adults should also do strengthening exercises at least twice a week. This is in addition to the moderate-intensity exercise.  Maintain a healthy weight  Body mass index (BMI) is a measurement that can be used to identify possible weight problems. It estimates body fat based on height and weight. Your health care provider can help determine your BMI and help you achieve or maintain a healthy weight.  For females 40 years of age and older: ? A BMI below 18.5 is considered underweight. ? A BMI of 18.5 to 24.9 is normal. ? A BMI of 25 to 29.9 is considered overweight. ? A BMI of 30 and above is considered obese.  Watch levels of cholesterol and blood lipids  You should start having your blood tested for lipids and cholesterol at 32 years of age, then have this test every 5 years.  You may need to have your cholesterol levels checked more often if: ? Your lipid or cholesterol levels are high. ? You are older than 32 years of age. ? You are at high risk for heart disease.  Cancer screening Lung Cancer  Lung cancer screening is recommended for adults 97-32 years old who are at high risk for lung cancer because of a history of smoking.  A yearly low-dose CT scan of the lungs is recommended for people who: ? Currently smoke. ? Have quit within the past 15 years. ? Have at least a 30-pack-year history of smoking. A pack year is smoking an average of one pack of cigarettes a day for 1 year.  Yearly screening should continue until it has been 15 years since you quit.  Yearly screening should stop if you develop a health problem that would prevent you from having lung cancer treatment.  Breast Cancer  Practice  breast  self-awareness. This means understanding how your breasts normally appear and feel.  It also means doing regular breast self-exams. Let your health care provider know about any changes, no matter how small.  If you are in your 20s or 30s, you should have a clinical breast exam (CBE) by a health care provider every 1-3 years as part of a regular health exam.  If you are 86 or older, have a CBE every year. Also consider having a breast X-ray (mammogram) every year.  If you have a family history of breast cancer, talk to your health care provider about genetic screening.  If you are at high risk for breast cancer, talk to your health care provider about having an MRI and a mammogram every year.  Breast cancer gene (BRCA) assessment is recommended for women who have family members with BRCA-related cancers. BRCA-related cancers include: ? Breast. ? Ovarian. ? Tubal. ? Peritoneal cancers.  Results of the assessment will determine the need for genetic counseling and BRCA1 and BRCA2 testing.  Cervical Cancer Your health care provider may recommend that you be screened regularly for cancer of the pelvic organs (ovaries, uterus, and vagina). This screening involves a pelvic examination, including checking for microscopic changes to the surface of your cervix (Pap test). You may be encouraged to have this screening done every 3 years, beginning at age 35.  For women ages 82-65, health care providers may recommend pelvic exams and Pap testing every 3 years, or they may recommend the Pap and pelvic exam, combined with testing for human papilloma virus (HPV), every 5 years. Some types of HPV increase your risk of cervical cancer. Testing for HPV may also be done on women of any age with unclear Pap test results.  Other health care providers may not recommend any screening for nonpregnant women who are considered low risk for pelvic cancer and who do not have symptoms. Ask your health care provider if a  screening pelvic exam is right for you.  If you have had past treatment for cervical cancer or a condition that could lead to cancer, you need Pap tests and screening for cancer for at least 20 years after your treatment. If Pap tests have been discontinued, your risk factors (such as having a new sexual partner) need to be reassessed to determine if screening should resume. Some women have medical problems that increase the chance of getting cervical cancer. In these cases, your health care provider may recommend more frequent screening and Pap tests.  Colorectal Cancer  This type of cancer can be detected and often prevented.  Routine colorectal cancer screening usually begins at 31 years of age and continues through 32 years of age.  Your health care provider may recommend screening at an earlier age if you have risk factors for colon cancer.  Your health care provider may also recommend using home test kits to check for hidden blood in the stool.  A small camera at the end of a tube can be used to examine your colon directly (sigmoidoscopy or colonoscopy). This is done to check for the earliest forms of colorectal cancer.  Routine screening usually begins at age 15.  Direct examination of the colon should be repeated every 5-10 years through 32 years of age. However, you may need to be screened more often if early forms of precancerous polyps or small growths are found.  Skin Cancer  Check your skin from head to toe regularly.  Tell your health care provider  about any new moles or changes in moles, especially if there is a change in a mole's shape or color.  Also tell your health care provider if you have a mole that is larger than the size of a pencil eraser.  Always use sunscreen. Apply sunscreen liberally and repeatedly throughout the day.  Protect yourself by wearing long sleeves, pants, a wide-brimmed hat, and sunglasses whenever you are outside.  Heart disease, diabetes, and  high blood pressure  High blood pressure causes heart disease and increases the risk of stroke. High blood pressure is more likely to develop in: ? People who have blood pressure in the high end of the normal range (130-139/85-89 mm Hg). ? People who are overweight or obese. ? People who are African American.  If you are 73-82 years of age, have your blood pressure checked every 3-5 years. If you are 7 years of age or older, have your blood pressure checked every year. You should have your blood pressure measured twice-once when you are at a hospital or clinic, and once when you are not at a hospital or clinic. Record the average of the two measurements. To check your blood pressure when you are not at a hospital or clinic, you can use: ? An automated blood pressure machine at a pharmacy. ? A home blood pressure monitor.  If you are between 34 years and 5 years old, ask your health care provider if you should take aspirin to prevent strokes.  Have regular diabetes screenings. This involves taking a blood sample to check your fasting blood sugar level. ? If you are at a normal weight and have a low risk for diabetes, have this test once every three years after 33 years of age. ? If you are overweight and have a high risk for diabetes, consider being tested at a younger age or more often. Preventing infection Hepatitis B  If you have a higher risk for hepatitis B, you should be screened for this virus. You are considered at high risk for hepatitis B if: ? You were born in a country where hepatitis B is common. Ask your health care provider which countries are considered high risk. ? Your parents were born in a high-risk country, and you have not been immunized against hepatitis B (hepatitis B vaccine). ? You have HIV or AIDS. ? You use needles to inject street drugs. ? You live with someone who has hepatitis B. ? You have had sex with someone who has hepatitis B. ? You get hemodialysis  treatment. ? You take certain medicines for conditions, including cancer, organ transplantation, and autoimmune conditions.  Hepatitis C  Blood testing is recommended for: ? Everyone born from [birth year] through 1965. ? Anyone with known risk factors for hepatitis C.  Sexually transmitted infections (STIs)  You should be screened for sexually transmitted infections (STIs) including gonorrhea and chlamydia if: ? You are sexually active and are younger than 32 years of age. ? You are older than 32 years of age and your health care provider tells you that you are at risk for this type of infection. ? Your sexual activity has changed since you were last screened and you are at an increased risk for chlamydia or gonorrhea. Ask your health care provider if you are at risk.  If you do not have HIV, but are at risk, it may be recommended that you take a prescription medicine daily to prevent HIV infection. This is called pre-exposure prophylaxis (PrEP). You are  considered at risk if: ? You are sexually active and do not regularly use condoms or know the HIV status of your partner(s). ? You take drugs by injection. ? You are sexually active with a partner who has HIV.  Talk with your health care provider about whether you are at high risk of being infected with HIV. If you choose to begin PrEP, you should first be tested for HIV. You should then be tested every 3 months for as long as you are taking PrEP. Pregnancy  If you are premenopausal and you may become pregnant, ask your health care provider about preconception counseling.  If you may become pregnant, take 400 to 800 micrograms (mcg) of folic acid every day.  If you want to prevent pregnancy, talk to your health care provider about birth control (contraception). Osteoporosis and menopause  Osteoporosis is a disease in which the bones lose minerals and strength with aging. This can result in serious bone fractures. Your risk for osteoporosis  can be identified using a bone density scan.  If you are 75 years of age or older, or if you are at risk for osteoporosis and fractures, ask your health care provider if you should be screened.  Ask your health care provider whether you should take a calcium or vitamin D supplement to lower your risk for osteoporosis.  Menopause may have certain physical symptoms and risks.  Hormone replacement therapy may reduce some of these symptoms and risks. Talk to your health care provider about whether hormone replacement therapy is right for you. Follow these instructions at home:  Schedule regular health, dental, and eye exams.  Stay current with your immunizations.  Do not use any tobacco products including cigarettes, chewing tobacco, or electronic cigarettes.  If you are pregnant, do not drink alcohol.  If you are breastfeeding, limit how much and how often you drink alcohol.  Limit alcohol intake to no more than 1 drink per day for nonpregnant women. One drink equals 12 ounces of beer, 5 ounces of wine, or 1 ounces of hard liquor.  Do not use street drugs.  Do not share needles.  Ask your health care provider for help if you need support or information about quitting drugs.  Tell your health care provider if you often feel depressed.  Tell your health care provider if you have ever been abused or do not feel safe at home. This information is not intended to replace advice given to you by your health care provider. Make sure you discuss any questions you have with your health care provider. Document Released: 08/29/2010 Document Revised: 07/22/2015 Document Reviewed: 11/17/2014 Elsevier Interactive Patient Education  2018 Elsevier Inc.      Agustina Caroli, MD [facility]

## 2017-11-01 LAB — COMPREHENSIVE METABOLIC PANEL
A/G RATIO: 1.8 (ref 1.2–2.2)
ALT: 17 IU/L (ref 0–32)
AST: 24 IU/L (ref 0–40)
Albumin: 4.4 g/dL (ref 3.5–5.5)
Alkaline Phosphatase: 65 IU/L (ref 39–117)
BUN/Creatinine Ratio: 13 (ref 9–23)
BUN: 12 mg/dL (ref 6–20)
Bilirubin Total: 0.4 mg/dL (ref 0.0–1.2)
CALCIUM: 9.6 mg/dL (ref 8.7–10.2)
CO2: 23 mmol/L (ref 20–29)
CREATININE: 0.93 mg/dL (ref 0.57–1.00)
Chloride: 104 mmol/L (ref 96–106)
GFR calc Af Amer: 94 mL/min/{1.73_m2} (ref >59)
GFR, EST NON AFRICAN AMERICAN: 82 mL/min/{1.73_m2} (ref >59)
Globulin, Total: 2.5 g/dL (ref 1.5–4.5)
Glucose: 83 mg/dL (ref 65–99)
POTASSIUM: 4.4 mmol/L (ref 3.5–5.2)
Sodium: 141 mmol/L (ref 134–144)
Total Protein: 6.9 g/dL (ref 6.0–8.5)

## 2017-11-01 LAB — CBC WITH DIFFERENTIAL/PLATELET
BASOS: 1 %
Basophils Absolute: 0 10*3/uL (ref 0.0–0.2)
EOS (ABSOLUTE): 0.1 10*3/uL (ref 0.0–0.4)
Eos: 2 %
Hematocrit: 42.9 % (ref 34.0–46.6)
Hemoglobin: 14.2 g/dL (ref 11.1–15.9)
IMMATURE GRANS (ABS): 0 10*3/uL (ref 0.0–0.1)
IMMATURE GRANULOCYTES: 0 %
LYMPHS: 23 %
Lymphocytes Absolute: 1.3 10*3/uL (ref 0.7–3.1)
MCH: 29.3 pg (ref 26.6–33.0)
MCHC: 33.1 g/dL (ref 31.5–35.7)
MCV: 89 fL (ref 79–97)
Monocytes Absolute: 0.5 10*3/uL (ref 0.1–0.9)
Monocytes: 8 %
NEUTROS PCT: 66 %
Neutrophils Absolute: 4 10*3/uL (ref 1.4–7.0)
PLATELETS: 224 10*3/uL (ref 150–450)
RBC: 4.85 x10E6/uL (ref 3.77–5.28)
RDW: 12.5 % (ref 12.3–15.4)
WBC: 5.9 10*3/uL (ref 3.4–10.8)

## 2017-11-01 LAB — THYROID PANEL WITH TSH
Free Thyroxine Index: 1.9 (ref 1.2–4.9)
T3 UPTAKE RATIO: 21 % — AB (ref 24–39)
T4 TOTAL: 9.2 ug/dL (ref 4.5–12.0)
TSH: 2.42 u[IU]/mL (ref 0.450–4.500)

## 2017-11-01 LAB — LITHIUM LEVEL: LITHIUM LVL: 0.6 mmol/L (ref 0.6–1.2)

## 2017-11-02 ENCOUNTER — Encounter: Payer: Self-pay | Admitting: *Deleted

## 2018-01-01 ENCOUNTER — Other Ambulatory Visit: Payer: Self-pay

## 2018-01-01 ENCOUNTER — Encounter: Payer: Self-pay | Admitting: Physician Assistant

## 2018-01-01 ENCOUNTER — Ambulatory Visit: Payer: Medicare Other | Admitting: Physician Assistant

## 2018-01-01 VITALS — BP 131/75 | HR 70 | Temp 98.4°F | Resp 18 | Ht 69.0 in | Wt 141.6 lb

## 2018-01-01 DIAGNOSIS — W5501XA Bitten by cat, initial encounter: Secondary | ICD-10-CM

## 2018-01-01 DIAGNOSIS — S60931A Unspecified superficial injury of right thumb, initial encounter: Secondary | ICD-10-CM | POA: Diagnosis not present

## 2018-01-01 MED ORDER — MUPIROCIN 2 % EX OINT: 1.0000 "application " | TOPICAL_OINTMENT | Freq: Three times a day (TID) | CUTANEOUS | 1 refills | Status: DC

## 2018-01-01 NOTE — Progress Notes (Signed)
Amber Schultz  MRN: [id number] DOB: August 11, 1985  Subjective:  Amber Schultz is a 32 y.o. female seen in office today for a chief complaint of cat bite to right thumb x 1 day ago. It was accidental. Noticed a blister this morning, some discomfort. Deneis pain, redness, warmth, purulent drainage, numbness and tingling. Denies smoking. No PMH of diabetes or MRSA.Cat is up to date on vaccinations. Pt up to date on Tdap, had Tdap vaccine 03/2017 in [place].   Review of Systems  Constitutional: Negative for chills, diaphoresis and fever.  Gastrointestinal: Negative for nausea and vomiting.  Skin: Negative for rash.  Hematological: Negative for adenopathy.    Patient Active Problem List   Diagnosis Date Noted  . Neck pain 08/02/2017  . Musculoskeletal pain 08/02/2017  . Muscle spasm 08/02/2017  . Acute upper respiratory infection 03/02/2016  . Anxiety 05/18/2015  . Bipolar I disorder ([facility]) 10/10/2012    Current Outpatient Medications on File Prior to Visit  Medication Sig Dispense Refill  . drospirenone-ethinyl estradiol (YAZ,GIANVI,LORYNA) 3-0.02 MG tablet Take 1 tablet by mouth daily.    . fluticasone (FLONASE) 50 MCG/ACT nasal spray Place 2 sprays into both nostrils daily. 16 g 6  . lamoTRIgine (LAMICTAL) 100 MG tablet Take 100 mg by mouth daily.    Marland Kitchen lithium carbonate 300 MG capsule Take 300 mg by mouth 3 (three) times daily with meals.     No current facility-administered medications on file prior to visit.     Allergies  Allergen Reactions  . Aleve [Naproxen Sodium]   . Aspirin   . Ciprofloxacin Hcl   . Ibuprofen   . Macrobid [Nitrofurantoin Monohyd Macro]     Chills, tingling, and nerve pain in legs   . Penicillins   . Sudafed [Pseudoephedrine Hcl]   . Sulfa Antibiotics      Objective:  BP 131/75   Pulse 70   Temp 98.4 F (36.9 C) (Oral)   Resp 18   Ht 5\' 9"  (1.753 m)   Wt 141 lb 9.6 oz (64.2 kg)   SpO2 99%   BMI 20.91 kg/m   Physical Exam    Constitutional: She is oriented to person, place, and time. She appears well-developed and well-nourished.  HENT:  Head: Normocephalic and atraumatic.  Eyes: Conjunctivae are normal.  Neck: Normal range of motion.  Cardiovascular:  Pulses:      Radial pulses are 2+ on the right side, and 2+ on the left side.  Pulmonary/Chest: Effort normal.  Neurological: She is alert and oriented to person, place, and time.  Skin: Skin is warm and dry.     Psychiatric: She has a normal mood and affect.  Vitals reviewed.  Wound care:  Verbal consent obtained from patient. Wound cleansed with soap, water, and iodine x 3.  Mupirocin ointment and dressing applied.     Assessment and Plan :  1. Cat bite, initial encounter Pt declines abx, although prophylactic abx were strongly recommended due to location despite is not appearing infected today. Educated on potential complications if infection does develop. She voices her understanding. Educated to cleanse wound daily and keep covered. May use topical ointment. Given strict return precautions.   Meds ordered this encounter  Medications  . mupirocin ointment (BACTROBAN) 2 %    Sig: Apply 1 application topically 3 (three) times daily.    Dispense:  22 g    Refill:  1    Order Specific Question:   Supervising Provider  AnswerGeorgina Quint [[id number]]   Side effects, risks, benefits, and alternatives of the medications and treatment plan prescribed today were discussed, and patient expressed understanding of the instructions given. No barriers to understanding were identified. Red flags discussed in detail. Pt expressed understanding regarding what to do in case of emergency/urgent symptoms.    Benjiman Core PA-C  Primary Care at [facility] 01/01/2018 2:11 PM

## 2018-01-01 NOTE — Patient Instructions (Addendum)
It is recommended that bites to the hand are treated with antibiotics.  However, since you refuse antibiotics, I suggest cleansing wound with soap and water daily.  Keep covered until healed. You may use topical mupirocin ointment.  If you develop any worsening pain, redness, warmth, or purulent drainage, abscess, fever, or chills, please seek care immediately.    If you have lab work done today you will be contacted with your lab results within the next 2 weeks.  If you have not heard from [place] then please contact us. The fastest way to get your results is to register for My Chart.  Animal Bite Animal bite wounds can get infected. It is important to get proper medical treatment. Ask your doctor if you need rabies treatment. Follow these instructions at home: Wound care  Follow instructions from your doctor about how to take care of your wound. Make sure you: ? Wash your hands with soap and water before you change your bandage (dressing). If you cannot use soap and water, use hand sanitizer. ? Change your bandage as told by your doctor. ? Leave stitches (sutures), skin glue, or skin tape (adhesive) strips in place. They may need to stay in place for 2 weeks or longer. If tape strips get loose and curl up, you may trim the loose edges. Do not remove tape strips completely unless your doctor says it is okay.  Check your wound every day for signs of infection. Watch for: ? Redness, swelling, or pain that gets worse. ? Fluid, blood, or pus. General instructions  Take or apply over-the-counter and prescription medicines only as told by your doctor.  If you were prescribed an antibiotic, take or apply it as told by your doctor. Do not stop using the antibiotic even if your condition improves.  Keep the injured area raised (elevated) above the level of your heart while you are sitting or lying down.  If directed, apply ice to the injured area. ? Put ice in a plastic bag. ? Place a towel  between your skin and the bag. ? Leave the ice on for 20 minutes, 2-3 times per day.  Keep all follow-up visits as told by your doctor. This is important. Contact a doctor if:  You have redness, swelling, or pain that gets worse.  You have a general feeling of sickness (malaise).  You feel sick to your stomach (nauseous).  You throw up (vomit).  You have pain that does not get better. Get help right away if:  You have a red streak going away from your wound.  You have fluid, blood, or pus coming from your wound.  You have a fever or chills.  You have trouble moving your injured area.  You have numbness or tingling anywhere on your body. This information is not intended to replace advice given to you by your health care provider. Make sure you discuss any questions you have with your health care provider. Document Released: 02/13/2005 Document Revised: 07/22/2015 Document Reviewed: 07/01/2014 Elsevier Interactive Patient Education  2018 [employer].   IF you received an x-ray today, you will receive an invoice from [facility]. Please contact [facility] at [phone] with questions or concerns regarding your invoice.   IF you received labwork today, you will receive an invoice from [place]. Please contact [facility] at [phone] with questions or concerns regarding your invoice.   Our billing staff will not be able to assist you with questions regarding bills from these companies.  You will  be contacted with the lab results as soon as they are available. The fastest way to get your results is to activate your My Chart account. Instructions are located on the last page of this paperwork. If you have not heard from [place] regarding the results in 2 weeks, please contact this office.

## 2018-05-03 ENCOUNTER — Other Ambulatory Visit: Payer: Self-pay

## 2018-05-03 ENCOUNTER — Encounter: Payer: Self-pay | Admitting: Emergency Medicine

## 2018-05-03 ENCOUNTER — Ambulatory Visit: Payer: Medicare Other | Admitting: Emergency Medicine

## 2018-05-03 VITALS — BP 120/81 | HR 71 | Temp 97.8°F | Ht 69.0 in | Wt 137.6 lb

## 2018-05-03 DIAGNOSIS — F319 Bipolar disorder, unspecified: Secondary | ICD-10-CM | POA: Diagnosis not present

## 2018-05-03 DIAGNOSIS — E049 Nontoxic goiter, unspecified: Secondary | ICD-10-CM

## 2018-05-03 NOTE — Patient Instructions (Addendum)
If you have lab work done today you will be contacted with your lab results within the next 2 weeks.  If you have not heard from [place] then please contact us. The fastest way to get your results is to register for My Chart.   IF you received an x-ray today, you will receive an invoice from [facility]. Please contact [facility] at [phone] with questions or concerns regarding your invoice.   IF you received labwork today, you will receive an invoice from [place]. Please contact [facility] at [phone] with questions or concerns regarding your invoice.   Our billing staff will not be able to assist you with questions regarding bills from these companies.  You will be contacted with the lab results as soon as they are available. The fastest way to get your results is to activate your My Chart account. Instructions are located on the last page of this paperwork. If you have not heard from [place] regarding the results in 2 weeks, please contact this office.      Goiter  A goiter is an enlarged thyroid gland. The thyroid is located in the lower front of the neck. It makes hormones that affect many body parts and systems, including the system that affects how quickly the body burns fuel for energy (metabolism). Most goiters are painless and are not a cause for concern. Some goiters can affect the way your thyroid makes thyroid hormones. Goiters and conditions that cause goiters can be treated, if necessary. What are the causes? Common causes of this condition include:  Lack (deficiency) of a mineral called iodine. The thyroid gland uses iodine to make thyroid hormones.  Diseases that attack healthy cells in the body (autoimmune diseases) and affect thyroid function, such as Graves' disease or Hashimoto's disease. These diseases may cause the body to produce too much thyroid hormone (hyperthyroidism) or too little of the hormone (hypothyroidism).  Conditions that cause  inflammation of the thyroid (thyroiditis).  One or more small growths on the thyroid (nodular goiter). Other causes include:  Medical problems caused by abnormal genes that are passed from parent to child (genetic defects).  Thyroid injury or infection.  Tumors that may or may not be cancerous.  Pregnancy.  Certain medicines.  Exposure to radiation. In some cases, the cause may not be known. What increases the risk? This condition is more likely to develop in:  People who do not get enough iodine in their diet.  People who have a family history of goiter.  Women.  People who are older than age 47.  People who smoke tobacco.  People who have had exposure to radiation. What are the signs or symptoms? The main symptom of this condition is swelling in the lower, front part of the neck. This swelling can range from a very small bump to a large lump. Other symptoms may include:  A tight feeling in the throat.  A hoarse voice.  Coughing.  Wheezing.  Difficulty swallowing or breathing.  Bulging veins in the neck.  Dizziness. When a goiter is the result of an overactive thyroid (hyperthyroidism), symptoms may also include:  Nervousness or restlessness.  Inability to tolerate heat.  Unexplained weight loss.  Diarrhea.  Change in the texture of hair or skin.  Changes in heartbeat, such as skipped beats, extra beats, or a rapid heart rate.  Loss of menstruation.  Shaky hands.  Increased appetite.  Sleep problems. When a goiter is the result of an underactive thyroid (  hypothyroidism), symptoms may also include:  Feeling like you have no energy (lethargy).  Inability to tolerate cold.  Weight gain that is not explained by a change in diet or exercise habits.  Dry skin.  Coarse hair.  Irregular menstrual periods.  Constipation.  Sadness or depression.  Fatigue. In some cases, there may not be any symptoms and the thyroid hormone levels may be  normal. How is this diagnosed? This condition may be diagnosed based on your symptoms, your medical history, and a physical exam. You may have tests, such as:  Blood tests to check thyroid function.  Imaging tests, such as: ? Ultrasound. ? CT scan. ? MRI. ? Thyroid scan.  Removal of a tissue sample (biopsy) of the goiter or any nodules. The sample will be tested to check for cancer. How is this treated? Treatment for this condition depends on the cause and your symptoms. Treatment may include:  Medicines to regulate thyroid hormone levels.  Anti-inflammatory medicines or steroid medicines, if the goiter is caused by inflammation.  Iodine supplements or changes to your diet, if the goiter is caused by iodine deficiency.  Radioactive iodine treatment.  Surgery to remove your thyroid. In some cases, you may only need regular check-ups with your health care provider to monitor your condition, and you may not need treatment. Follow these instructions at home:  Follow instructions from your health care provider about any changes to your diet.  Take over-the-counter and prescription medicines only as told by your health care provider. These include supplements.  Do not use any products that contain nicotine or tobacco, such as cigarettes and e-cigarettes. If you need help quitting, ask your health care provider.  Keep all follow-up visits as told by your health care provider. This is important. Contact a health care provider if:  Your symptoms do not get better with treatment.  You have nausea, vomiting, or diarrhea. Get help right away if:  You have sudden, unexplained confusion or other mental changes.  You have a fever.  You have chest pain.  You have trouble breathing or swallowing.  You suddenly become very weak.  You experience extreme restlessness.  You feel your heart racing. Summary  A goiter is an enlarged thyroid gland.  The thyroid gland is located in  the lower front of the neck. It makes hormones that affect many body parts and systems, including the system that affects how quickly the body burns fuel for energy (metabolism).  The main symptom of this condition is swelling in the lower, front part of the neck. This swelling can range from a very small bump to a large lump.  Treatment for this condition depends on the cause and your symptoms. You may need medicines, supplements, or regular monitoring of your condition. This information is not intended to replace advice given to you by your health care provider. Make sure you discuss any questions you have with your health care provider. Document Released: 08/03/2009 Document Revised: 11/09/2016 Document Reviewed: 11/09/2016 Elsevier Interactive Patient Education  [employer].

## 2018-05-03 NOTE — Progress Notes (Signed)
Amber Schultz 33 y.o.   Chief Complaint  Patient presents with  . thyroid check    HISTORY OF PRESENT ILLNESS: This is a 33 y.o. female with history of bipolar disorder and goiter.  Requesting thyroid check.  Also requesting lithium level check.  Asymptomatic.  No other medical concerns at this point.  HPI   Prior to Admission medications   Medication Sig Start Date End Date Taking? Authorizing Provider  drospirenone-ethinyl estradiol (YAZ,GIANVI,LORYNA) 3-0.02 MG tablet Take 1 tablet by mouth daily.   Yes [provider]  fluticasone (FLONASE) 50 MCG/ACT nasal spray Place 2 sprays into both nostrils daily. 03/21/16  Yes McVey, Madelaine Bhat, PA-C  lamoTRIgine (LAMICTAL) 100 MG tablet Take 100 mg by mouth daily.   Yes [provider]  lithium carbonate 300 MG capsule Take 300 mg by mouth 3 (three) times daily with meals.   Yes [provider]    Allergies  Allergen Reactions  . Aleve [Naproxen Sodium]   . Aspirin   . Ciprofloxacin Hcl   . Ibuprofen   . Macrobid [Nitrofurantoin Monohyd Macro]     Chills, tingling, and nerve pain in legs   . Penicillins   . Sudafed [Pseudoephedrine Hcl]   . Sulfa Antibiotics   . Tramadol Nausea And Vomiting    Patient Active Problem List   Diagnosis Date Noted  . Neck pain 08/02/2017  . Musculoskeletal pain 08/02/2017  . Muscle spasm 08/02/2017  . Acute upper respiratory infection 03/02/2016  . Anxiety 05/18/2015  . Bipolar I disorder (HCC) 10/10/2012    Past Medical History:  Diagnosis Date  . Allergy   . Anxiety   . Bipolar 1 disorder (HCC)   . Depression   . Goiter     No past surgical history on file.  Social History   Socioeconomic History  . Marital status: Single    Spouse name: Not on file  . Number of children: Not on file  . Years of education: Not on file  . Highest education level: Not on file  Occupational History  . Not on file  Social Needs  . Financial resource  strain: Not on file  . Food insecurity:    Worry: Not on file    Inability: Not on file  . Transportation needs:    Medical: Not on file    Non-medical: Not on file  Tobacco Use  . Smoking status: Never Smoker  . Smokeless tobacco: Never Used  Substance and Sexual Activity  . Alcohol use: No    Alcohol/week: 0.0 standard drinks  . Drug use: No  . Sexual activity: Not on file  Lifestyle  . Physical activity:    Days per week: Not on file    Minutes per session: Not on file  . Stress: Not on file  Relationships  . Social connections:    Talks on phone: Not on file    Gets together: Not on file    Attends religious service: Not on file    Active member of club or organization: Not on file    Attends meetings of clubs or organizations: Not on file    Relationship status: Not on file  . Intimate partner violence:    Fear of current or ex partner: Not on file    Emotionally abused: Not on file    Physically abused: Not on file    Forced sexual activity: Not on file  Other Topics Concern  . Not on file  Social History  Narrative  . Not on file    Family History  Problem Relation Age of Onset  . Hypertension Mother   . Cancer Mother        melanoma  . Cancer Father        prostate  . Bipolar disorder Father   . Cancer Maternal Grandmother   . Cancer Maternal Grandfather   . Hypertension Paternal Grandmother   . Cancer Paternal Grandfather      Review of Systems  Constitutional: Negative.  Negative for chills and fever.  HENT: Negative.  Negative for congestion.   Eyes: Negative.  Negative for blurred vision and double vision.  Respiratory: Negative.  Negative for cough and shortness of breath.   Cardiovascular: Negative.  Negative for chest pain and palpitations.  Gastrointestinal: Negative.  Negative for abdominal pain, diarrhea, nausea and vomiting.  Genitourinary: Negative.   Musculoskeletal: Negative.   Skin: Negative.  Negative for rash.  Neurological:  Negative.  Negative for dizziness and headaches.  Endo/Heme/Allergies: Negative.   All other systems reviewed and are negative.  Today's Vitals   05/03/18 1515  BP: 120/81  Pulse: 71  Temp: 97.8 F (36.6 C)  TempSrc: Oral  SpO2: 100%  Weight: 137 lb 9.6 oz (62.4 kg)  Height:  (1.753 m)   Body mass index is 20.32 kg/m.   Physical Exam Vitals signs reviewed.  Constitutional:      Appearance: Normal appearance.  HENT:     Head: Normocephalic and atraumatic.     Mouth/Throat:     Mouth: Mucous membranes are moist.     Pharynx: Oropharynx is clear.  Eyes:     Extraocular Movements: Extraocular movements intact.     Conjunctiva/sclera: Conjunctivae normal.     Pupils: Pupils are equal, round, and reactive to light.  Neck:     Musculoskeletal: Normal range of motion and neck supple.     Comments: Positive goiter, non-tender. Cardiovascular:     Rate and Rhythm: Normal rate and regular rhythm.     Heart sounds: Normal heart sounds.  Pulmonary:     Effort: Pulmonary effort is normal.     Breath sounds: Normal breath sounds.  Musculoskeletal: Normal range of motion.  Skin:    General: Skin is warm and dry.     Capillary Refill: Capillary refill takes less than 2 seconds.  Neurological:     General: No focal deficit present.     Mental Status: She is alert and oriented to person, place, and time.  Psychiatric:        Mood and Affect: Mood normal.        Behavior: Behavior normal.      ASSESSMENT & PLAN: Severa was seen today for thyroid check.  Diagnoses and all orders for this visit:  Goiter -     T4, free -     Thyroid Panel With TSH  Bipolar I disorder (HCC) -     Lithium level    Patient Instructions       If you have lab work done today you will be contacted with your lab results within the next 2 weeks.  If you have not heard from [place] then please contact us. The fastest way to get your results is to register for My Chart.   IF you  received an x-ray today, you will receive an invoice from [facility]. Please contact [facility] at [phone] with questions or concerns regarding your invoice.   IF you received labwork today,  you will receive an invoice from [place]. Please contact [facility] at [phone] with questions or concerns regarding your invoice.   Our billing staff will not be able to assist you with questions regarding bills from these companies.  You will be contacted with the lab results as soon as they are available. The fastest way to get your results is to activate your My Chart account. Instructions are located on the last page of this paperwork. If you have not heard from [place] regarding the results in 2 weeks, please contact this office.      Goiter  A goiter is an enlarged thyroid gland. The thyroid is located in the lower front of the neck. It makes hormones that affect many body parts and systems, including the system that affects how quickly the body burns fuel for energy (metabolism). Most goiters are painless and are not a cause for concern. Some goiters can affect the way your thyroid makes thyroid hormones. Goiters and conditions that cause goiters can be treated, if necessary. What are the causes? Common causes of this condition include:  Lack (deficiency) of a mineral called iodine. The thyroid gland uses iodine to make thyroid hormones.  Diseases that attack healthy cells in the body (autoimmune diseases) and affect thyroid function, such as Graves' disease or Hashimoto's disease. These diseases may cause the body to produce too much thyroid hormone (hyperthyroidism) or too little of the hormone (hypothyroidism).  Conditions that cause inflammation of the thyroid (thyroiditis).  One or more small growths on the thyroid (nodular goiter). Other causes include:  Medical problems caused by abnormal genes that are passed from parent to child (genetic defects).  Thyroid  injury or infection.  Tumors that may or may not be cancerous.  Pregnancy.  Certain medicines.  Exposure to radiation. In some cases, the cause may not be known. What increases the risk? This condition is more likely to develop in:  People who do not get enough iodine in their diet.  People who have a family history of goiter.  Women.  People who are older than age 67.  People who smoke tobacco.  People who have had exposure to radiation. What are the signs or symptoms? The main symptom of this condition is swelling in the lower, front part of the neck. This swelling can range from a very small bump to a large lump. Other symptoms may include:  A tight feeling in the throat.  A hoarse voice.  Coughing.  Wheezing.  Difficulty swallowing or breathing.  Bulging veins in the neck.  Dizziness. When a goiter is the result of an overactive thyroid (hyperthyroidism), symptoms may also include:  Nervousness or restlessness.  Inability to tolerate heat.  Unexplained weight loss.  Diarrhea.  Change in the texture of hair or skin.  Changes in heartbeat, such as skipped beats, extra beats, or a rapid heart rate.  Loss of menstruation.  Shaky hands.  Increased appetite.  Sleep problems. When a goiter is the result of an underactive thyroid (hypothyroidism), symptoms may also include:  Feeling like you have no energy (lethargy).  Inability to tolerate cold.  Weight gain that is not explained by a change in diet or exercise habits.  Dry skin.  Coarse hair.  Irregular menstrual periods.  Constipation.  Sadness or depression.  Fatigue. In some cases, there may not be any symptoms and the thyroid hormone levels may be normal. How is this diagnosed? This condition may be diagnosed based on your symptoms, your medical history,  and a physical exam. You may have tests, such as:  Blood tests to check thyroid function.  Imaging tests, such  as: ? Ultrasound. ? CT scan. ? MRI. ? Thyroid scan.  Removal of a tissue sample (biopsy) of the goiter or any nodules. The sample will be tested to check for cancer. How is this treated? Treatment for this condition depends on the cause and your symptoms. Treatment may include:  Medicines to regulate thyroid hormone levels.  Anti-inflammatory medicines or steroid medicines, if the goiter is caused by inflammation.  Iodine supplements or changes to your diet, if the goiter is caused by iodine deficiency.  Radioactive iodine treatment.  Surgery to remove your thyroid. In some cases, you may only need regular check-ups with your health care provider to monitor your condition, and you may not need treatment. Follow these instructions at home:  Follow instructions from your health care provider about any changes to your diet.  Take over-the-counter and prescription medicines only as told by your health care provider. These include supplements.  Do not use any products that contain nicotine or tobacco, such as cigarettes and e-cigarettes. If you need help quitting, ask your health care provider.  Keep all follow-up visits as told by your health care provider. This is important. Contact a health care provider if:  Your symptoms do not get better with treatment.  You have nausea, vomiting, or diarrhea. Get help right away if:  You have sudden, unexplained confusion or other mental changes.  You have a fever.  You have chest pain.  You have trouble breathing or swallowing.  You suddenly become very weak.  You experience extreme restlessness.  You feel your heart racing. Summary  A goiter is an enlarged thyroid gland.  The thyroid gland is located in the lower front of the neck. It makes hormones that affect many body parts and systems, including the system that affects how quickly the body burns fuel for energy (metabolism).  The main symptom of this condition is  swelling in the lower, front part of the neck. This swelling can range from a very small bump to a large lump.  Treatment for this condition depends on the cause and your symptoms. You may need medicines, supplements, or regular monitoring of your condition. This information is not intended to replace advice given to you by your health care provider. Make sure you discuss any questions you have with your health care provider. Document Released: 08/03/2009 Document Revised: 11/09/2016 Document Reviewed: 11/09/2016 Elsevier Interactive Patient Education  2019 [employer]      Edwina Barth, MD [facility]

## 2018-05-04 LAB — THYROID PANEL WITH TSH
Free Thyroxine Index: 1.7 (ref 1.2–4.9)
T3 UPTAKE RATIO: 19 % — AB (ref 24–39)
T4, Total: 9 ug/dL (ref 4.5–12.0)
TSH: 0.768 u[IU]/mL (ref 0.450–4.500)

## 2018-05-04 LAB — T4, FREE: Free T4: 1.2 ng/dL (ref 0.82–1.77)

## 2018-05-04 LAB — LITHIUM LEVEL: Lithium Lvl: 0.4 mmol/L — ABNORMAL LOW (ref 0.6–1.2)

## 2020-06-04 ENCOUNTER — Encounter: Payer: Self-pay | Admitting: Family

## 2020-06-04 ENCOUNTER — Other Ambulatory Visit: Payer: Self-pay

## 2020-06-04 ENCOUNTER — Ambulatory Visit (INDEPENDENT_AMBULATORY_CARE_PROVIDER_SITE_OTHER): Payer: Medicare Other | Admitting: Family

## 2020-06-04 VITALS — BP 122/78 | HR 86 | Temp 99.3°F | Ht 69.0 in | Wt 142.0 lb

## 2020-06-04 DIAGNOSIS — R319 Hematuria, unspecified: Secondary | ICD-10-CM | POA: Diagnosis not present

## 2020-06-04 DIAGNOSIS — R300 Dysuria: Secondary | ICD-10-CM

## 2020-06-04 LAB — POCT URINALYSIS DIP (CLINITEK)
Bilirubin, UA: NEGATIVE
Glucose, UA: NEGATIVE mg/dL
Ketones, POC UA: NEGATIVE mg/dL
Nitrite, UA: NEGATIVE
POC PROTEIN,UA: NEGATIVE
Spec Grav, UA: <=1.005 — AB (ref 1.010–1.025)
Urobilinogen, UA: 0.2 E.U./dL
pH, UA: 6 (ref 5.0–8.0)

## 2020-06-04 MED ORDER — CIPROFLOXACIN HCL 250 MG PO TABS: 250.0000 mg | ORAL_TABLET | Freq: Two times a day (BID) | ORAL | 0 refills | Status: DC

## 2020-06-04 NOTE — Progress Notes (Signed)
Amber Schultz is a 35 y.o. female with the following history as recorded in [employer]:  Patient Active Problem List   Diagnosis Date Noted  . Neck pain 08/02/2017  . Musculoskeletal pain 08/02/2017  . Muscle spasm 08/02/2017  . Acute upper respiratory infection 03/02/2016  . Anxiety 05/18/2015  . Bipolar I disorder (HCC) 10/10/2012    Current Outpatient Medications  Medication Sig Dispense Refill  . ciprofloxacin (CIPRO) 250 MG tablet Take 1 tablet (250 mg total) by mouth 2 (two) times daily. 6 tablet 0  . drospirenone-ethinyl estradiol (YAZ,GIANVI,LORYNA) 3-0.02 MG tablet Take 1 tablet by mouth daily.    . fluticasone (FLONASE) 50 MCG/ACT nasal spray Place 2 sprays into both nostrils daily. 16 g 6  . lamoTRIgine (LAMICTAL) 100 MG tablet Take 100 mg by mouth daily.    Marland Kitchen lithium carbonate 300 MG capsule Take 300 mg by mouth 3 (three) times daily with meals.     No current facility-administered medications for this visit.    Allergies: Aleve [naproxen sodium], Aspirin, Ciprofloxacin hcl, Ibuprofen, Macrobid [nitrofurantoin monohyd macro], Penicillins, Sudafed [pseudoephedrine hcl], Sulfa antibiotics, and Tramadol  Past Medical History:  Diagnosis Date  . Allergy   . Anxiety   . Bipolar 1 disorder (HCC)   . Depression   . Goiter     No past surgical history on file.  Family History  Problem Relation Age of Onset  . Hypertension Mother   . Cancer Mother        melanoma  . Cancer Father        prostate  . Bipolar disorder Father   . Cancer Maternal Grandmother   . Cancer Maternal Grandfather   . Hypertension Paternal Grandmother   . Cancer Paternal Grandfather     Social History   Tobacco Use  . Smoking status: Never Smoker  . Smokeless tobacco: Never Used  Substance Use Topics  . Alcohol use: No    Alcohol/week: 0.0 standard drinks    Subjective:   Concerned for possible UTI- symptoms started 2 weeks ago and saw her GYN with same symptoms; U/A at that time  showed leukocytes and blood; U/A done today does show same symptoms; per patient, culture done at GYN was unremarkable; no follow-up was scheduled as it was felt that the blood noted on U/A was related to her period; she notes that today she felt dizzy earlier this am and had some right sided back pain; both of these issues have since resolved with water intake;   Per patient, she can take Cipro as long it is a very low dosage;     Objective:  Vitals:   06/04/20 1131  BP: 122/78  Pulse: 86  Temp: 99.3 F (37.4 C)  TempSrc: Oral  SpO2: 98%  Weight: 142 lb (64.4 kg)  Height: 5\' 9"  (1.753 m)    General: Well developed, well nourished, in no acute distress  Skin : Warm and dry.  Head: Normocephalic and atraumatic  Lungs: Respirations unlabored; clear to auscultation bilaterally without wheeze, rales, rhonchi  CVS exam: normal rate and regular rhythm.  Neurologic: Alert and oriented; speech intact; face symmetrical; moves all extremities well; CNII-XII intact without focal deficit   Assessment:  1. Dysuria   2. Hematuria, unspecified type     Plan:  Will check urine culture today; start Cipro 250 mg bid x 3 days- per patient, she can tolerate this dosage; strict ER precautions- discussed possibility of kidney stone but unable to get renal CT scheduled  before upcoming weekend; follow up to be determined based on urine culture results;  She also needs to re-establish with her PCP- she has not seen him since 2020;   This visit occurred during the SARS-CoV-2 public health emergency.  Safety protocols were in place, including screening questions prior to the visit, additional usage of staff PPE, and extensive cleaning of exam room while observing appropriate contact time as indicated for disinfecting solutions.     No follow-ups on file.  Orders Placed This Encounter  Procedures  . Urine Culture  . POCT URINALYSIS DIP (CLINITEK)    Requested Prescriptions   Signed Prescriptions  Disp Refills  . ciprofloxacin (CIPRO) 250 MG tablet 6 tablet 0    Sig: Take 1 tablet (250 mg total) by mouth 2 (two) times daily.

## 2020-06-05 ENCOUNTER — Emergency Department (HOSPITAL_COMMUNITY)
Admission: EM | Admit: 2020-06-05 | Discharge: 2020-06-05 | Disposition: A | Discharge status: Home / Self Care | Payer: Medicare Other | Attending: Emergency Medicine | Admitting: Emergency Medicine

## 2020-06-05 ENCOUNTER — Other Ambulatory Visit: Payer: Self-pay

## 2020-06-05 ENCOUNTER — Encounter (HOSPITAL_COMMUNITY): Payer: Self-pay | Admitting: Emergency Medicine

## 2020-06-05 ENCOUNTER — Emergency Department (HOSPITAL_COMMUNITY): Payer: Medicare Other

## 2020-06-05 DIAGNOSIS — R251 Tremor, unspecified: Secondary | ICD-10-CM | POA: Diagnosis not present

## 2020-06-05 DIAGNOSIS — R8271 Bacteriuria: Secondary | ICD-10-CM | POA: Insufficient documentation

## 2020-06-05 DIAGNOSIS — R319 Hematuria, unspecified: Secondary | ICD-10-CM | POA: Insufficient documentation

## 2020-06-05 DIAGNOSIS — R509 Fever, unspecified: Secondary | ICD-10-CM | POA: Diagnosis present

## 2020-06-05 DIAGNOSIS — T368X5A Adverse effect of other systemic antibiotics, initial encounter: Secondary | ICD-10-CM | POA: Diagnosis not present

## 2020-06-05 DIAGNOSIS — T50905A Adverse effect of unspecified drugs, medicaments and biological substances, initial encounter: Secondary | ICD-10-CM

## 2020-06-05 LAB — COMPREHENSIVE METABOLIC PANEL
ALT: 16 U/L (ref 0–44)
AST: 22 U/L (ref 15–41)
Albumin: 3.8 g/dL (ref 3.5–5.0)
Alkaline Phosphatase: 56 U/L (ref 38–126)
Anion gap: 7 (ref 5–15)
BUN: 10 mg/dL (ref 6–20)
CO2: 24 mmol/L (ref 22–32)
Calcium: 9.1 mg/dL (ref 8.9–10.3)
Chloride: 106 mmol/L (ref 98–111)
Creatinine, Ser: 0.84 mg/dL (ref 0.44–1.00)
GFR, Estimated: >60 mL/min (ref >60)
Glucose, Bld: 118 mg/dL — ABNORMAL HIGH (ref 70–99)
Potassium: 4 mmol/L (ref 3.5–5.1)
Sodium: 137 mmol/L (ref 135–145)
Total Bilirubin: 0.8 mg/dL (ref 0.3–1.2)
Total Protein: 7.1 g/dL (ref 6.5–8.1)

## 2020-06-05 LAB — PREGNANCY, URINE: Preg Test, Ur: NEGATIVE

## 2020-06-05 LAB — URINALYSIS, ROUTINE W REFLEX MICROSCOPIC
Bilirubin Urine: NEGATIVE
Glucose, UA: NEGATIVE mg/dL
Ketones, ur: NEGATIVE mg/dL
Leukocytes,Ua: NEGATIVE
Nitrite: NEGATIVE
Protein, ur: NEGATIVE mg/dL
Specific Gravity, Urine: 1.005 (ref 1.005–1.030)
pH: 7 (ref 5.0–8.0)

## 2020-06-05 LAB — I-STAT BETA HCG BLOOD, ED (MC, WL, AP ONLY): I-stat hCG, quantitative: <5 m[IU]/mL (ref <5)

## 2020-06-05 LAB — CBC WITH DIFFERENTIAL/PLATELET
Abs Immature Granulocytes: 0.02 10*3/uL (ref 0.00–0.07)
Basophils Absolute: 0 10*3/uL (ref 0.0–0.1)
Basophils Relative: 0 %
Eosinophils Absolute: 0 10*3/uL (ref 0.0–0.5)
Eosinophils Relative: 0 %
HCT: 42.4 % (ref 36.0–46.0)
Hemoglobin: 14.1 g/dL (ref 12.0–15.0)
Immature Granulocytes: 0 %
Lymphocytes Relative: 8 %
Lymphs Abs: 0.6 10*3/uL — ABNORMAL LOW (ref 0.7–4.0)
MCH: 30.3 pg (ref 26.0–34.0)
MCHC: 33.3 g/dL (ref 30.0–36.0)
MCV: 91.2 fL (ref 80.0–100.0)
Monocytes Absolute: 0.6 10*3/uL (ref 0.1–1.0)
Monocytes Relative: 9 %
Neutro Abs: 5.8 10*3/uL (ref 1.7–7.7)
Neutrophils Relative %: 83 %
Platelets: 160 10*3/uL (ref 150–400)
RBC: 4.65 MIL/uL (ref 3.87–5.11)
RDW: 12.4 % (ref 11.5–15.5)
WBC: 7 10*3/uL (ref 4.0–10.5)
nRBC: 0 % (ref 0.0–0.2)

## 2020-06-05 LAB — URINE CULTURE

## 2020-06-05 NOTE — ED Provider Notes (Addendum)
MSE was initiated and I personally evaluated the patient and placed orders (if any) at  1:06 AM on June 05, 2020.  HPI:  Amber Schultz is a 35 y.o. female presents to the emergency department with complaints of possible medication allergic reaction from cipro for possible UTI. Reports feeling jittery and shaky a few hours after taking the medicine. She also reported temp of 101.7 Max.   ROS:  Positive: none  Negative: Fevers chills, nausea vomiting, abdominal pain, chest pain, shortness of breath, rash, oral lesions.  PE:  BP (!) 121/58 (BP Location: Right Arm)   Pulse 89   Temp 98.3 F (36.8 C) (Oral)   Resp 18   Ht 5\' 9"  (1.753 m)   Wt 64.4 kg   SpO2 98%   BMI 20.97 kg/m   General: Awake, no distress  HEENT: Atraumatic, no oral lesions. No conjunctivitis. Resp: Normal effort  Cardiac: normal rate Abd: Nondistended, nontender  MSK: moves all extremities without difficulty Neuro: speech clear   MDM: MSE completed and orders placed as needed.    Discussed with the patient that exiting the department prior to completion of the work-up is AMA and there is no guarantee that there are no emergency medical conditions present. Pt states understanding.    The patient appears stable so that the remainder of the MSE may be completed by another provider.     , MD 06/05/20 [phone]

## 2020-06-05 NOTE — ED Triage Notes (Addendum)
Pt was put on cipro this morning. Took it around 2p. Around 7p, she started experiencing fever, chills, tachycardia and tremors. States that she has had a reaction to cipro in the past at a higher dose, but she is allergic to most other antibiotics so they tried it again.

## 2020-06-05 NOTE — ED Provider Notes (Signed)
[facility]-EMERGENCY DEPT Provider Note  CSN: [id number] Arrival date & time: 06/05/20 0002  Chief Complaint(s) Medication Reaction  HPI Amber Schultz is a 35 y.o. female with complaints of possible medication allergic reaction from cipro for possible UTI. Reports feeling jittery and shaky a few hours after taking the medicine. She also reported temp of 101.7 Max.   Patient denies any recent fevers or chills.  No nausea or vomiting.  No abdominal pain.  No chest pain or shortness of breath.  No rash or lesions.  HPI  Past Medical History Past Medical History:  Diagnosis Date  . Allergy   . Anxiety   . Bipolar 1 disorder (HCC)   . Depression   . Goiter    Patient Active Problem List   Diagnosis Date Noted  . Neck pain 08/02/2017  . Musculoskeletal pain 08/02/2017  . Muscle spasm 08/02/2017  . Acute upper respiratory infection 03/02/2016  . Anxiety 05/18/2015  . Bipolar I disorder (HCC) 10/10/2012   Home Medication(s) Prior to Admission medications   Medication Sig Start Date End Date Taking? Authorizing Provider  drospirenone-ethinyl estradiol (YAZ,GIANVI,LORYNA) 3-0.02 MG tablet Take 1 tablet by mouth daily.    [provider]  fluticasone (FLONASE) 50 MCG/ACT nasal spray Place 2 sprays into both nostrils daily. 03/21/16   McVey, Madelaine Bhat, PA-C  lamoTRIgine (LAMICTAL) 100 MG tablet Take 100 mg by mouth daily.    [provider]  lithium carbonate 300 MG capsule Take 300 mg by mouth 3 (three) times daily with meals.    [provider]                                                                                                                                    Past Surgical History History reviewed. No pertinent surgical history. Family History Family History  Problem Relation Age of Onset  . Hypertension Mother   . Cancer Mother        melanoma  . Cancer Father        prostate  . Bipolar disorder Father    . Cancer Maternal Grandmother   . Cancer Maternal Grandfather   . Hypertension Paternal Grandmother   . Cancer Paternal Grandfather     Social History Social History   Tobacco Use  . Smoking status: Never Smoker  . Smokeless tobacco: Never Used  Substance Use Topics  . Alcohol use: No    Alcohol/week: 0.0 standard drinks  . Drug use: No   Allergies Aleve [naproxen sodium], Aspirin, Ciprofloxacin hcl, Ibuprofen, Macrobid [nitrofurantoin monohyd macro], Penicillins, Sudafed [pseudoephedrine hcl], Sulfa antibiotics, and Tramadol  Review of Systems Review of Systems All other systems are reviewed and are negative for acute change except as noted in the HPI  Physical Exam Vital Signs  I have reviewed the triage vital signs BP 124/74   Pulse 84   Temp 98.3 F (36.8 C) (Oral)  Resp 16   Ht 5\' 9"  (1.753 m)   Wt 64.4 kg   SpO2 97%   BMI 20.97 kg/m   Physical Exam Vitals reviewed.  Constitutional:      General: She is not in acute distress.    Appearance: She is well-developed. She is not diaphoretic.  HENT:     Head: Normocephalic and atraumatic.     Nose: Nose normal.     Mouth/Throat:     Lips: No lesions.     Mouth: No oral lesions or angioedema.     Dentition: No gum lesions.     Tongue: No lesions.     Palate: No lesions.     Pharynx: No posterior oropharyngeal erythema or uvula swelling.  Eyes:     General: No scleral icterus.       Right eye: No discharge.        Left eye: No discharge.     Conjunctiva/sclera: Conjunctivae normal.     Pupils: Pupils are equal, round, and reactive to light.  Cardiovascular:     Rate and Rhythm: Normal rate and regular rhythm.     Heart sounds: No murmur heard. No friction rub. No gallop.   Pulmonary:     Effort: Pulmonary effort is normal. No respiratory distress.     Breath sounds: Normal breath sounds. No stridor. No rales.  Abdominal:     General: There is no distension.     Palpations: Abdomen is soft.      Tenderness: There is no abdominal tenderness.  Musculoskeletal:        General: No tenderness.     Cervical back: Normal range of motion and neck supple.  Skin:    General: Skin is warm and dry.     Findings: No erythema or rash.  Neurological:     Mental Status: She is alert and oriented to person, place, and time.     ED Results and Treatments Labs (all labs ordered are listed, but only abnormal results are displayed) Labs Reviewed  CBC WITH DIFFERENTIAL/PLATELET - Abnormal; Notable for the following components:      Result Value   Lymphs Abs 0.6 (*)    All other components within normal limits  COMPREHENSIVE METABOLIC PANEL - Abnormal; Notable for the following components:   Glucose, Bld 118 (*)    All other components within normal limits  URINALYSIS, ROUTINE W REFLEX MICROSCOPIC - Abnormal; Notable for the following components:   Color, Urine STRAW (*)    Hgb urine dipstick SMALL (*)    Bacteria, UA MANY (*)    All other components within normal limits  PREGNANCY, URINE  I-STAT BETA HCG BLOOD, ED ([facility], [facility], [facility] ONLY)                                                                                                                         EKG  EKG Interpretation  Date/Time:    Ventricular Rate:    PR Interval:  QRS Duration:   QT Interval:    QTC Calculation:   R Axis:     Text Interpretation:        Radiology CT Renal Stone Study  Result Date: 06/05/2020 CLINICAL DATA:  Hematuria of unknown cause EXAM: CT ABDOMEN AND PELVIS WITHOUT CONTRAST TECHNIQUE: Multidetector CT imaging of the abdomen and pelvis was performed following the standard protocol without IV contrast. COMPARISON:  None. FINDINGS: Lower chest:  No contributory findings. Hepatobiliary: No focal liver abnormality.No evidence of biliary obstruction or stone. Pancreas: Unremarkable. Spleen: Unremarkable. Adrenals/Urinary Tract: Negative adrenals. No hydronephrosis or stone. Unremarkable bladder.  Stomach/Bowel: No obstruction. No appendicitis. Generalized colonic stool which is incidental based on the provided history. Vascular/Lymphatic: No acute vascular abnormality. No mass or adenopathy. Reproductive:Nabothian cyst appearance. Other: Trace pelvic fluid, usually physiologic in this setting. Musculoskeletal: No acute abnormalities. IMPRESSION: Negative CT.  No urinary calculi. Electronically Signed   By: Marnee Spring M.D.   On: 06/05/2020 05:26    Pertinent labs & imaging results that were available during my care of the patient were reviewed by me and considered in my medical decision making (see chart for details).  Medications Ordered in ED Medications - No data to display                                                                                                                                  Procedures Procedures  (including critical care time)  Medical Decision Making / ED Course I have reviewed the nursing notes for this encounter and the patient's prior records (if available in EHR or on provided paperwork).   Amber Schultz was evaluated in Emergency Department on 06/05/2020 for the symptoms described in the history of present illness. She was evaluated in the context of the global COVID-19 pandemic, which necessitated consideration that the patient might be at risk for infection with the SARS-CoV-2 virus that causes COVID-19. Institutional protocols and algorithms that pertain to the evaluation of patients at risk for COVID-19 are in a state of rapid change based on information released by regulatory bodies including the CDC and federal and state organizations. These policies and algorithms were followed during the patient's care in the ED.  Medication reaction. No evidence of anaphylaxis. Questionable fever, but no fever here w/o intervention. No evidence to suggest SJS or TENS. Labs reassuring. UA obtained given questionable UTI. Notable for mild hematuria and  bacteria. No leuks or nitrites. Culture sent by PCP already.  CT stone negative.  Recommended to discontinue Cipro.  Await cultures for speciation to ensure appropriate antibiotic.      Final Clinical Impression(s) / ED Diagnoses Final diagnoses:  Medication reaction, initial encounter  Bacteria in urine    The patient appears reasonably screened and/or stabilized for discharge and I doubt any other medical condition or other [facility] requiring further screening, evaluation, or treatment in the ED at this time prior to discharge. Safe  for discharge with strict return precautions.  Disposition: Discharge  Condition: Good  I have discussed the results, Dx and Tx plan with the patient/family who expressed understanding and agree(s) with the plan. Discharge instructions discussed at length. The patient/family was given strict return precautions who verbalized understanding of the instructions. No further questions at time of discharge.    ED Discharge Orders    None        Follow Up: Georgina QuintSagardia, Miguel Jose, MD [address] [place] [id number] [phone]  Call  to schedule an appointment for close follow up     This chart was dictated using voice recognition software.  Despite best efforts to proofread,  errors can occur which can change the documentation meaning.   Nira Connardama, Davian Hanshaw Eduardo, MD 06/05/20 [phone]

## 2020-06-07 ENCOUNTER — Other Ambulatory Visit: Payer: Self-pay | Admitting: Family

## 2020-06-07 ENCOUNTER — Telehealth: Payer: Self-pay | Admitting: Emergency Medicine

## 2020-06-07 DIAGNOSIS — R319 Hematuria, unspecified: Secondary | ICD-10-CM

## 2020-06-07 NOTE — Telephone Encounter (Signed)
FYI to PCP  Please advise if you are okay with ordering the labs the pt requested.

## 2020-06-07 NOTE — Telephone Encounter (Signed)
Patient called back in regards to recent lab results. She said that she went to the ED on 06-04-20 for a med reaction to Cipro. She said that they did a renal CT and did not see anything. She said that they told her she may have had micro kidney stones. Please advise

## 2020-06-07 NOTE — Telephone Encounter (Signed)
I am not her PCP; she will need to discuss this with Dr. Alvy Bimler at their upcoming visit. You can also forward to him for his review- if he knows her and is comfortable ordering, maybe that would be a way to get this done.

## 2020-06-07 NOTE — Telephone Encounter (Signed)
[facility] FYI  Caller states she is having a medication reaction to medicine she was put on today. Caller states that she was started on Cipro. Caller states that she started shaking all over and chills. Caller states that she took Cipro at 2:30 and symptoms started around 7:30pm. Caller states that she is burning up and her pulse is elevated. Temp is 101.59F orally and pulse 100-120.  Advised to go to ED. Patient understood and agreed.

## 2020-06-07 NOTE — Telephone Encounter (Signed)
Patient called and was wondering if she could come in today for lab work. She was wondering if her lithium levels could be checked. She can be reached at [phone].

## 2020-06-07 NOTE — Telephone Encounter (Signed)
Pt is still worried about her lithium levels so we have rescheduled her for a sooner appointment with her PCP. Tomorrow at 10 am. Pt stated understanding.

## 2020-06-07 NOTE — Telephone Encounter (Signed)
I have called the pt and informed her of the message from both providers. She stated that she wanted them check and it would take too long to talk to the doctors at [facility] the prescribes the medication to check them. I have informed her the her primary provider will need to see you in order to get labs drawn and since the providers at [facility] are managing there lithium then she will have to contact them. She stated understanding.

## 2020-06-07 NOTE — Telephone Encounter (Signed)
Needs office visit.  No labs until I see her.  Thanks.

## 2020-06-07 NOTE — Telephone Encounter (Signed)
Please see message below and let me know if you want me to call pt and get a lab visit only done.

## 2020-06-08 ENCOUNTER — Encounter: Payer: Self-pay | Admitting: Emergency Medicine

## 2020-06-08 ENCOUNTER — Other Ambulatory Visit: Payer: Self-pay

## 2020-06-08 ENCOUNTER — Ambulatory Visit (INDEPENDENT_AMBULATORY_CARE_PROVIDER_SITE_OTHER): Payer: Medicare Other | Admitting: Emergency Medicine

## 2020-06-08 VITALS — BP 108/78 | HR 79 | Temp 98.3°F | Ht 69.0 in | Wt 140.4 lb

## 2020-06-08 DIAGNOSIS — F319 Bipolar disorder, unspecified: Secondary | ICD-10-CM

## 2020-06-08 DIAGNOSIS — N3001 Acute cystitis with hematuria: Secondary | ICD-10-CM | POA: Diagnosis not present

## 2020-06-08 LAB — HM DIABETES EYE EXAM

## 2020-06-08 NOTE — Progress Notes (Signed)
Amber Schultz 35 y.o.   Chief Complaint  Patient presents with  . check Lithium level    HISTORY OF PRESENT ILLNESS: This is a 35 y.o. female developed possible UTI with hematuria, fever and chills last Thursday.  Took 1 tablet of Cipro doing the morning on Friday.  Was seen here on 06/04/2020 and referred to ED with suspicions of kidney stone.  Normal renal CT.  No further antibiotics. Urinary symptoms gone.  Asymptomatic.  Urine culture showed small amount of gram-negative microorganisms.  Patient does not want to take antibiotics at present time.  Multiple allergies.  Wants to wait on new culture. Has history of bipolar disorder, on lithium.  Wants to get lithium levels checked. No other complaints or medical concerns today.  HPI   Prior to Admission medications   Medication Sig Start Date End Date Taking? Authorizing Provider  drospirenone-ethinyl estradiol (YAZ,GIANVI,LORYNA) 3-0.02 MG tablet Take 1 tablet by mouth daily.   Yes [provider]  fluticasone (FLONASE) 50 MCG/ACT nasal spray Place 2 sprays into both nostrils daily. 03/21/16  Yes McVey, Madelaine Bhat, PA-C  lamoTRIgine (LAMICTAL) 100 MG tablet Take 100 mg by mouth daily.   Yes [provider]  lithium carbonate 300 MG capsule Take 300 mg by mouth 3 (three) times daily with meals.   Yes [provider]    Allergies  Allergen Reactions  . Aleve [Naproxen Sodium]   . Aspirin   . Ciprofloxacin Hcl   . Ibuprofen   . Macrobid [Nitrofurantoin Monohyd Macro]     Chills, tingling, and nerve pain in legs   . Penicillins   . Sudafed [Pseudoephedrine Hcl]   . Sulfa Antibiotics   . Tramadol Nausea And Vomiting    Patient Active Problem List   Diagnosis Date Noted  . Neck pain 08/02/2017  . Musculoskeletal pain 08/02/2017  . Muscle spasm 08/02/2017  . Acute upper respiratory infection 03/02/2016  . Anxiety 05/18/2015  . Bipolar I disorder (HCC) 10/10/2012    Past Medical History:   Diagnosis Date  . Allergy   . Anxiety   . Bipolar 1 disorder (HCC)   . Depression   . Goiter     History reviewed. No pertinent surgical history.  Social History   Socioeconomic History  . Marital status: Single    Spouse name: Not on file  . Number of children: Not on file  . Years of education: Not on file  . Highest education level: Not on file  Occupational History  . Not on file  Tobacco Use  . Smoking status: Never Smoker  . Smokeless tobacco: Never Used  Substance and Sexual Activity  . Alcohol use: No    Alcohol/week: 0.0 standard drinks  . Drug use: No  . Sexual activity: Not on file  Other Topics Concern  . Not on file  Social History Narrative  . Not on file   Social Determinants of Health   Financial Resource Strain: Not on file  Food Insecurity: Not on file  Transportation Needs: Not on file  Physical Activity: Not on file  Stress: Not on file  Social Connections: Not on file  Intimate Partner Violence: Not on file    Family History  Problem Relation Age of Onset  . Hypertension Mother   . Cancer Mother        melanoma  . Cancer Father        prostate  . Bipolar disorder Father   . Cancer Maternal Grandmother   .  Cancer Maternal Grandfather   . Hypertension Paternal Grandmother   . Cancer Paternal Grandfather      Review of Systems  Constitutional: Negative.  Negative for chills and fever.  HENT: Negative.  Negative for congestion and sore throat.   Respiratory: Negative.  Negative for cough and shortness of breath.   Cardiovascular: Negative.  Negative for chest pain and palpitations.  Gastrointestinal: Negative.  Negative for abdominal pain, diarrhea, nausea and vomiting.  Genitourinary: Negative.  Negative for dysuria, flank pain, frequency, hematuria and urgency.  Skin: Negative.  Negative for rash.  Neurological: Negative.  Negative for dizziness and headaches.  All other systems reviewed and are negative.   Today's Vitals    06/08/20 1027  BP: 108/78  Pulse: 79  Temp: 98.3 F (36.8 C)  TempSrc: Oral  SpO2: 98%  Weight: 140 lb 6.4 oz (63.7 kg)  Height: 5\' 9"  (1.753 m)   Body mass index is 20.73 kg/m.  Physical Exam Vitals reviewed.  Constitutional:      Appearance: Normal appearance.  HENT:     Head: Normocephalic.  Eyes:     Extraocular Movements: Extraocular movements intact.     Conjunctiva/sclera: Conjunctivae normal.     Pupils: Pupils are equal, round, and reactive to light.  Cardiovascular:     Rate and Rhythm: Normal rate and regular rhythm.     Pulses: Normal pulses.     Heart sounds: Normal heart sounds.  Pulmonary:     Effort: Pulmonary effort is normal.     Breath sounds: Normal breath sounds.  Musculoskeletal:        General: Normal range of motion.     Cervical back: Normal range of motion and neck supple.  Skin:    General: Skin is warm and dry.     Capillary Refill: Capillary refill takes less than 2 seconds.  Neurological:     General: No focal deficit present.     Mental Status: She is alert and oriented to person, place, and time.  Psychiatric:        Mood and Affect: Mood normal.        Behavior: Behavior normal.      ASSESSMENT & PLAN: Amber Schultz was seen today for check lithium level.  Diagnoses and all orders for this visit:  Bipolar I disorder (HCC) -     Lithium level  Acute cystitis with hematuria Comments: Partially treated Orders: -     Urine Culture    Patient Instructions   Health Maintenance, Female Adopting a healthy lifestyle and getting preventive care are important in promoting health and wellness. Ask your health care provider about:  The right schedule for you to have regular tests and exams.  Things you can do on your own to prevent diseases and keep yourself healthy. What should I know about diet, weight, and exercise? Eat a healthy diet  Eat a diet that includes plenty of vegetables, fruits, low-fat dairy products, and lean  protein.  Do not eat a lot of foods that are high in solid fats, added sugars, or sodium.   Maintain a healthy weight Body mass index (BMI) is used to identify weight problems. It estimates body fat based on height and weight. Your health care provider can help determine your BMI and help you achieve or maintain a healthy weight. Get regular exercise Get regular exercise. This is one of the most important things you can do for your health. Most adults should:  Exercise for at least 150 minutes  each week. The exercise should increase your heart rate and make you sweat (moderate-intensity exercise).  Do strengthening exercises at least twice a week. This is in addition to the moderate-intensity exercise.  Spend less time sitting. Even light physical activity can be beneficial. Watch cholesterol and blood lipids Have your blood tested for lipids and cholesterol at 35 years of age, then have this test every 5 years. Have your cholesterol levels checked more often if:  Your lipid or cholesterol levels are high.  You are older than 35 years of age.  You are at high risk for heart disease. What should I know about cancer screening? Depending on your health history and family history, you may need to have cancer screening at various ages. This may include screening for:  Breast cancer.  Cervical cancer.  Colorectal cancer.  Skin cancer.  Lung cancer. What should I know about heart disease, diabetes, and high blood pressure? Blood pressure and heart disease  High blood pressure causes heart disease and increases the risk of stroke. This is more likely to develop in people who have high blood pressure readings, are of African descent, or are overweight.  Have your blood pressure checked: ? Every 3-5 years if you are 52-49 years of age. ? Every year if you are 8 years old or older. Diabetes Have regular diabetes screenings. This checks your fasting blood sugar level. Have the  screening done:  Once every three years after age 1 if you are at a normal weight and have a low risk for diabetes.  More often and at a younger age if you are overweight or have a high risk for diabetes. What should I know about preventing infection? Hepatitis B If you have a higher risk for hepatitis B, you should be screened for this virus. Talk with your health care provider to find out if you are at risk for hepatitis B infection. Hepatitis C Testing is recommended for:  Everyone born from [birth year] through 1965.  Anyone with known risk factors for hepatitis C. Sexually transmitted infections (STIs)  Get screened for STIs, including gonorrhea and chlamydia, if: ? You are sexually active and are younger than 35 years of age. ? You are older than 35 years of age and your health care provider tells you that you are at risk for this type of infection. ? Your sexual activity has changed since you were last screened, and you are at increased risk for chlamydia or gonorrhea. Ask your health care provider if you are at risk.  Ask your health care provider about whether you are at high risk for HIV. Your health care provider may recommend a prescription medicine to help prevent HIV infection. If you choose to take medicine to prevent HIV, you should first get tested for HIV. You should then be tested every 3 months for as long as you are taking the medicine. Pregnancy  If you are about to stop having your period (premenopausal) and you may become pregnant, seek counseling before you get pregnant.  Take 400 to 800 micrograms (mcg) of folic acid every day if you become pregnant.  Ask for birth control (contraception) if you want to prevent pregnancy. Osteoporosis and menopause Osteoporosis is a disease in which the bones lose minerals and strength with aging. This can result in bone fractures. If you are 87 years old or older, or if you are at risk for osteoporosis and fractures, ask your health  care provider if you should:  Be screened  for bone loss.  Take a calcium or vitamin D supplement to lower your risk of fractures.  Be given hormone replacement therapy (HRT) to treat symptoms of menopause. Follow these instructions at home: Lifestyle  Do not use any products that contain nicotine or tobacco, such as cigarettes, e-cigarettes, and chewing tobacco. If you need help quitting, ask your health care provider.  Do not use street drugs.  Do not share needles.  Ask your health care provider for help if you need support or information about quitting drugs. Alcohol use  Do not drink alcohol if: ? Your health care provider tells you not to drink. ? You are pregnant, may be pregnant, or are planning to become pregnant.  If you drink alcohol: ? Limit how much you use to 0-1 drink a day. ? Limit intake if you are breastfeeding.  Be aware of how much alcohol is in your drink. In the [place] one drink equals one 12 oz bottle of beer (355 mL), one 5 oz glass of wine (148 mL), or one 1 oz glass of hard liquor (44 mL). General instructions  Schedule regular health, dental, and eye exams.  Stay current with your vaccines.  Tell your health care provider if: ? You often feel depressed. ? You have ever been abused or do not feel safe at home. Summary  Adopting a healthy lifestyle and getting preventive care are important in promoting health and wellness.  Follow your health care provider's instructions about healthy diet, exercising, and getting tested or screened for diseases.  Follow your health care provider's instructions on monitoring your cholesterol and blood pressure. This information is not intended to replace advice given to you by your health care provider. Make sure you discuss any questions you have with your health care provider. Document Revised: 02/06/2018 Document Reviewed: 02/06/2018 Elsevier Patient Education  2021 Elsevier Inc.      Edwina Barth,  MD [facility]

## 2020-06-08 NOTE — Patient Instructions (Signed)
Health Maintenance, Female Adopting a healthy lifestyle and getting preventive care are important in promoting health and wellness. Ask your health care provider about:  The right schedule for you to have regular tests and exams.  Things you can do on your own to prevent diseases and keep yourself healthy. What should I know about diet, weight, and exercise? Eat a healthy diet  Eat a diet that includes plenty of vegetables, fruits, low-fat dairy products, and lean protein.  Do not eat a lot of foods that are high in solid fats, added sugars, or sodium.   Maintain a healthy weight Body mass index (BMI) is used to identify weight problems. It estimates body fat based on height and weight. Your health care provider can help determine your BMI and help you achieve or maintain a healthy weight. Get regular exercise Get regular exercise. This is one of the most important things you can do for your health. Most adults should:  Exercise for at least 150 minutes each week. The exercise should increase your heart rate and make you sweat (moderate-intensity exercise).  Do strengthening exercises at least twice a week. This is in addition to the moderate-intensity exercise.  Spend less time sitting. Even light physical activity can be beneficial. Watch cholesterol and blood lipids Have your blood tested for lipids and cholesterol at 35 years of age, then have this test every 5 years. Have your cholesterol levels checked more often if:  Your lipid or cholesterol levels are high.  You are older than 35 years of age.  You are at high risk for heart disease. What should I know about cancer screening? Depending on your health history and family history, you may need to have cancer screening at various ages. This may include screening for:  Breast cancer.  Cervical cancer.  Colorectal cancer.  Skin cancer.  Lung cancer. What should I know about heart disease, diabetes, and high blood  pressure? Blood pressure and heart disease  High blood pressure causes heart disease and increases the risk of stroke. This is more likely to develop in people who have high blood pressure readings, are of African descent, or are overweight.  Have your blood pressure checked: ? Every 3-5 years if you are 18-39 years of age. ? Every year if you are 40 years old or older. Diabetes Have regular diabetes screenings. This checks your fasting blood sugar level. Have the screening done:  Once every three years after age 40 if you are at a normal weight and have a low risk for diabetes.  More often and at a younger age if you are overweight or have a high risk for diabetes. What should I know about preventing infection? Hepatitis B If you have a higher risk for hepatitis B, you should be screened for this virus. Talk with your health care provider to find out if you are at risk for hepatitis B infection. Hepatitis C Testing is recommended for:  Everyone born from 1945 through 1965.  Anyone with known risk factors for hepatitis C. Sexually transmitted infections (STIs)  Get screened for STIs, including gonorrhea and chlamydia, if: ? You are sexually active and are younger than 35 years of age. ? You are older than 35 years of age and your health care provider tells you that you are at risk for this type of infection. ? Your sexual activity has changed since you were last screened, and you are at increased risk for chlamydia or gonorrhea. Ask your health care provider   if you are at risk.  Ask your health care provider about whether you are at high risk for HIV. Your health care provider may recommend a prescription medicine to help prevent HIV infection. If you choose to take medicine to prevent HIV, you should first get tested for HIV. You should then be tested every 3 months for as long as you are taking the medicine. Pregnancy  If you are about to stop having your period (premenopausal) and  you may become pregnant, seek counseling before you get pregnant.  Take 400 to 800 micrograms (mcg) of folic acid every day if you become pregnant.  Ask for birth control (contraception) if you want to prevent pregnancy. Osteoporosis and menopause Osteoporosis is a disease in which the bones lose minerals and strength with aging. This can result in bone fractures. If you are 65 years old or older, or if you are at risk for osteoporosis and fractures, ask your health care provider if you should:  Be screened for bone loss.  Take a calcium or vitamin D supplement to lower your risk of fractures.  Be given hormone replacement therapy (HRT) to treat symptoms of menopause. Follow these instructions at home: Lifestyle  Do not use any products that contain nicotine or tobacco, such as cigarettes, e-cigarettes, and chewing tobacco. If you need help quitting, ask your health care provider.  Do not use street drugs.  Do not share needles.  Ask your health care provider for help if you need support or information about quitting drugs. Alcohol use  Do not drink alcohol if: ? Your health care provider tells you not to drink. ? You are pregnant, may be pregnant, or are planning to become pregnant.  If you drink alcohol: ? Limit how much you use to 0-1 drink a day. ? Limit intake if you are breastfeeding.  Be aware of how much alcohol is in your drink. In the [place] one drink equals one 12 oz bottle of beer (355 mL), one 5 oz glass of wine (148 mL), or one 1 oz glass of hard liquor (44 mL). General instructions  Schedule regular health, dental, and eye exams.  Stay current with your vaccines.  Tell your health care provider if: ? You often feel depressed. ? You have ever been abused or do not feel safe at home. Summary  Adopting a healthy lifestyle and getting preventive care are important in promoting health and wellness.  Follow your health care provider's instructions about healthy  diet, exercising, and getting tested or screened for diseases.  Follow your health care provider's instructions on monitoring your cholesterol and blood pressure. This information is not intended to replace advice given to you by your health care provider. Make sure you discuss any questions you have with your health care provider. Document Revised: 02/06/2018 Document Reviewed: 02/06/2018 Elsevier Patient Education  2021 [employer]vier Inc.  

## 2020-06-09 LAB — LITHIUM LEVEL: Lithium Lvl: 0.5 mmol/L — ABNORMAL LOW (ref 0.6–1.2)

## 2020-06-09 LAB — URINE CULTURE: Result:: NO GROWTH

## 2020-06-15 ENCOUNTER — Encounter: Payer: Self-pay | Admitting: *Deleted

## 2020-06-22 ENCOUNTER — Encounter: Payer: Medicare Other | Admitting: Emergency Medicine

## 2020-10-19 ENCOUNTER — Encounter: Payer: Medicare Other | Admitting: Emergency Medicine

## 2020-11-03 ENCOUNTER — Telehealth: Payer: Self-pay | Admitting: Emergency Medicine

## 2020-11-03 NOTE — Telephone Encounter (Signed)
Left message for patient to call me back at [phone] to schedule Medicare Annual Wellness Visit   No hx of AWV eligible as of 10/28/09  Please schedule at anytime with [facility] Advisor if patient calls the office back.    40 Minutes appointment   Any questions, please call me at [phone]

## 2020-11-18 ENCOUNTER — Other Ambulatory Visit: Payer: Self-pay

## 2020-11-22 ENCOUNTER — Inpatient Hospital Stay: Payer: Medicare Other

## 2020-12-07 ENCOUNTER — Ambulatory Visit: Payer: Medicare Other | Admitting: Emergency Medicine

## 2021-01-06 ENCOUNTER — Encounter: Payer: Self-pay | Admitting: Emergency Medicine

## 2021-01-06 ENCOUNTER — Other Ambulatory Visit: Payer: Self-pay

## 2021-01-06 ENCOUNTER — Ambulatory Visit (INDEPENDENT_AMBULATORY_CARE_PROVIDER_SITE_OTHER): Payer: Medicare Other | Admitting: Emergency Medicine

## 2021-01-06 VITALS — BP 102/68 | HR 73 | Ht 69.0 in | Wt 144.0 lb

## 2021-01-06 DIAGNOSIS — E049 Nontoxic goiter, unspecified: Secondary | ICD-10-CM

## 2021-01-06 DIAGNOSIS — Z13228 Encounter for screening for other metabolic disorders: Secondary | ICD-10-CM | POA: Diagnosis not present

## 2021-01-06 DIAGNOSIS — Z130 Encounter for screening for diseases of the blood and blood-forming organs and certain disorders involving the immune mechanism: Secondary | ICD-10-CM | POA: Diagnosis not present

## 2021-01-06 DIAGNOSIS — Z0000 Encounter for general adult medical examination without abnormal findings: Secondary | ICD-10-CM

## 2021-01-06 DIAGNOSIS — Z1329 Encounter for screening for other suspected endocrine disorder: Secondary | ICD-10-CM

## 2021-01-06 DIAGNOSIS — Z13220 Encounter for screening for lipoid disorders: Secondary | ICD-10-CM

## 2021-01-06 DIAGNOSIS — F319 Bipolar disorder, unspecified: Secondary | ICD-10-CM

## 2021-01-06 LAB — COMPREHENSIVE METABOLIC PANEL
ALT: 16 U/L (ref 0–35)
AST: 22 U/L (ref 0–37)
Albumin: 4.3 g/dL (ref 3.5–5.2)
Alkaline Phosphatase: 60 U/L (ref 39–117)
BUN: 17 mg/dL (ref 6–23)
CO2: 29 mEq/L (ref 19–32)
Calcium: 9.3 mg/dL (ref 8.4–10.5)
Chloride: 105 mEq/L (ref 96–112)
Creatinine, Ser: 0.86 mg/dL (ref 0.40–1.20)
GFR: 87.37 mL/min (ref >60.00)
Glucose, Bld: 93 mg/dL (ref 70–99)
Potassium: 4.2 mEq/L (ref 3.5–5.1)
Sodium: 139 mEq/L (ref 135–145)
Total Bilirubin: 0.4 mg/dL (ref 0.2–1.2)
Total Protein: 7.1 g/dL (ref 6.0–8.3)

## 2021-01-06 LAB — CBC WITH DIFFERENTIAL/PLATELET
Basophils Absolute: 0 10*3/uL (ref 0.0–0.1)
Basophils Relative: 0.6 % (ref 0.0–3.0)
Eosinophils Absolute: 0.1 10*3/uL (ref 0.0–0.7)
Eosinophils Relative: 1.5 % (ref 0.0–5.0)
HCT: 41.6 % (ref 36.0–46.0)
Hemoglobin: 13.6 g/dL (ref 12.0–15.0)
Lymphocytes Relative: 22.1 % (ref 12.0–46.0)
Lymphs Abs: 1.3 10*3/uL (ref 0.7–4.0)
MCHC: 32.7 g/dL (ref 30.0–36.0)
MCV: 89.1 fl (ref 78.0–100.0)
Monocytes Absolute: 0.5 10*3/uL (ref 0.1–1.0)
Monocytes Relative: 8 % (ref 3.0–12.0)
Neutro Abs: 4 10*3/uL (ref 1.4–7.7)
Neutrophils Relative %: 67.8 % (ref 43.0–77.0)
Platelets: 155 10*3/uL (ref 150.0–400.0)
RBC: 4.67 Mil/uL (ref 3.87–5.11)
RDW: 12.7 % (ref 11.5–15.5)
WBC: 5.9 10*3/uL (ref 4.0–10.5)

## 2021-01-06 LAB — LIPID PANEL
Cholesterol: 174 mg/dL (ref 0–200)
HDL: 68.3 mg/dL (ref >39.00)
LDL Cholesterol: 80 mg/dL (ref 0–99)
NonHDL: 105.94
Total CHOL/HDL Ratio: 3
Triglycerides: 132 mg/dL (ref 0.0–149.0)
VLDL: 26.4 mg/dL (ref 0.0–40.0)

## 2021-01-06 LAB — HEMOGLOBIN A1C: Hgb A1c MFr Bld: 5.3 % (ref 4.6–6.5)

## 2021-01-06 LAB — VITAMIN B12: Vitamin B-12: 149 pg/mL — ABNORMAL LOW (ref 211–911)

## 2021-01-06 NOTE — Progress Notes (Signed)
Amber Schultz 35 y.o.   Chief Complaint  Patient presents with   Annual Exam    HISTORY OF PRESENT ILLNESS: This is a 35 y.o. female here for annual exam. Doing well.  Has no complaints or medical concerns today. Has the following chronic medical problems: 1.  Bipolar disorder: Presently on Lamictal and lithium.  Psychiatrist with [facility].  Sees him on a regular basis.  Stable and doing well. 2.  History of goiter.  No symptoms of hyper or hypothyroidism.  Stable. Sees gynecologist on a regular basis.  No problems.  HPI   Prior to Admission medications   Medication Sig Start Date End Date Taking? Authorizing Provider  drospirenone-ethinyl estradiol (YAZ,GIANVI,LORYNA) 3-0.02 MG tablet Take 1 tablet by mouth daily.    [provider]  fluticasone (FLONASE) 50 MCG/ACT nasal spray Place 2 sprays into both nostrils daily. 03/21/16   McVey, Madelaine Bhat, PA-C  lamoTRIgine (LAMICTAL) 100 MG tablet Take 100 mg by mouth daily.    [provider]  lithium carbonate 300 MG capsule Take 300 mg by mouth 3 (three) times daily with meals.    [provider]    Allergies  Allergen Reactions   Aleve [Naproxen Sodium]    Aspirin    Ciprofloxacin Hcl    Ibuprofen    Macrobid [Nitrofurantoin Monohyd Macro]     Chills, tingling, and nerve pain in legs    Penicillins    Sudafed [Pseudoephedrine Hcl]    Sulfa Antibiotics    Tramadol Nausea And Vomiting    Patient Active Problem List   Diagnosis Date Noted   Neck pain 08/02/2017   Musculoskeletal pain 08/02/2017   Muscle spasm 08/02/2017   Anxiety 05/18/2015   Bipolar I disorder (HCC) 10/10/2012    Past Medical History:  Diagnosis Date   Allergy    Anxiety    Bipolar 1 disorder (HCC)    Depression    Goiter     No past surgical history on file.  Social History   Socioeconomic History   Marital status: Single    Spouse name: Not on file   Number of children: Not on file    Years of education: Not on file   Highest education level: Not on file  Occupational History   Not on file  Tobacco Use   Smoking status: Never   Smokeless tobacco: Never  Substance and Sexual Activity   Alcohol use: No    Alcohol/week: 0.0 standard drinks   Drug use: No   Sexual activity: Not on file  Other Topics Concern   Not on file  Social History Narrative   Not on file   Social Determinants of Health   Financial Resource Strain: Not on file  Food Insecurity: Not on file  Transportation Needs: Not on file  Physical Activity: Not on file  Stress: Not on file  Social Connections: Not on file  Intimate Partner Violence: Not on file    Family History  Problem Relation Age of Onset   Hypertension Mother    Cancer Mother        melanoma   Cancer Father        prostate   Bipolar disorder Father    Cancer Maternal Grandmother    Cancer Maternal Grandfather    Hypertension Paternal Grandmother    Cancer Paternal Grandfather      Review of Systems  Constitutional: Negative.  Negative for chills and fever.  HENT: Negative.  Negative for congestion  and sore throat.   Respiratory: Negative.  Negative for cough and shortness of breath.   Cardiovascular: Negative.  Negative for chest pain and palpitations.  Gastrointestinal: Negative.  Negative for abdominal pain, blood in stool, diarrhea, melena, nausea and vomiting.  Genitourinary: Negative.  Negative for dysuria and hematuria.  Skin: Negative.  Negative for rash.  Neurological: Negative.  Negative for dizziness and headaches.  All other systems reviewed and are negative.  Today's Vitals   01/06/21 1026  BP: 102/68  Pulse: 73  SpO2: 97%  Weight: 144 lb (65.3 kg)  Height: 5\' 9"  (1.753 m)   Body mass index is 21.27 kg/m.  Physical Exam Vitals reviewed.  Constitutional:      Appearance: Normal appearance.  HENT:     Head: Normocephalic.     Right Ear: Tympanic membrane, ear canal and external ear normal.      Left Ear: Tympanic membrane, ear canal and external ear normal.     Mouth/Throat:     Mouth: Mucous membranes are moist.     Pharynx: Oropharynx is clear.  Eyes:     Extraocular Movements: Extraocular movements intact.     Conjunctiva/sclera: Conjunctivae normal.     Pupils: Pupils are equal, round, and reactive to light.  Cardiovascular:     Rate and Rhythm: Normal rate and regular rhythm.     Pulses: Normal pulses.     Heart sounds: Normal heart sounds.  Pulmonary:     Effort: Pulmonary effort is normal.     Breath sounds: Normal breath sounds.  Abdominal:     General: Bowel sounds are normal. There is no distension.     Palpations: Abdomen is soft.     Tenderness: There is no abdominal tenderness.  Musculoskeletal:     Cervical back: Normal range of motion and neck supple.     Right lower leg: No edema.     Left lower leg: No edema.  Skin:    General: Skin is warm and dry.     Capillary Refill: Capillary refill takes less than 2 seconds.  Neurological:     General: No focal deficit present.     Mental Status: She is alert and oriented to person, place, and time.  Psychiatric:        Mood and Affect: Mood normal.        Behavior: Behavior normal.     ASSESSMENT & PLAN: Problem List Items Addressed This Visit       Other   Bipolar I disorder ([place])   Other Visit Diagnoses     Routine general medical examination at a health care facility    -  Primary   Screening for deficiency anemia       Relevant Orders   CBC with Differential/Platelet   Screening for lipoid disorders       Relevant Orders   Lipid panel   Screening for endocrine, metabolic and immunity disorder       Relevant Orders   Vitamin B12   Hemoglobin A1c   Comprehensive metabolic panel   Goiter       Relevant Orders   Thyroid Panel With TSH   US THYROID      Modifiable risk factors discussed with patient. Anticipatory guidance according to age provided. The following topics were also  discussed: Social Determinants of Health Smoking.  Non-smoker. Diet and nutrition Benefits of exercise Cancer family history review.  Sees gynecologist on a regular basis. Vaccinations recommendations Cardiovascular risk assessment Mental health  including depression and anxiety Fall and accident prevention  Patient Instructions  Health Maintenance, Female Adopting a healthy lifestyle and getting preventive care are important in promoting health and wellness. Ask your health care provider about: The right schedule for you to have regular tests and exams. Things you can do on your own to prevent diseases and keep yourself healthy. What should I know about diet, weight, and exercise? Eat a healthy diet  Eat a diet that includes plenty of vegetables, fruits, low-fat dairy products, and lean protein. Do not eat a lot of foods that are high in solid fats, added sugars, or sodium. Maintain a healthy weight Body mass index (BMI) is used to identify weight problems. It estimates body fat based on height and weight. Your health care provider can help determine your BMI and help you achieve or maintain a healthy weight. Get regular exercise Get regular exercise. This is one of the most important things you can do for your health. Most adults should: Exercise for at least 150 minutes each week. The exercise should increase your heart rate and make you sweat (moderate-intensity exercise). Do strengthening exercises at least twice a week. This is in addition to the moderate-intensity exercise. Spend less time sitting. Even light physical activity can be beneficial. Watch cholesterol and blood lipids Have your blood tested for lipids and cholesterol at 35 years of age, then have this test every 5 years. Have your cholesterol levels checked more often if: Your lipid or cholesterol levels are high. You are older than 35 years of age. You are at high risk for heart disease. What should I know about  cancer screening? Depending on your health history and family history, you may need to have cancer screening at various ages. This may include screening for: Breast cancer. Cervical cancer. Colorectal cancer. Skin cancer. Lung cancer. What should I know about heart disease, diabetes, and high blood pressure? Blood pressure and heart disease High blood pressure causes heart disease and increases the risk of stroke. This is more likely to develop in people who have high blood pressure readings or are overweight. Have your blood pressure checked: Every 3-5 years if you are 63-7 years of age. Every year if you are 34 years old or older. Diabetes Have regular diabetes screenings. This checks your fasting blood sugar level. Have the screening done: Once every three years after age 77 if you are at a normal weight and have a low risk for diabetes. More often and at a younger age if you are overweight or have a high risk for diabetes. What should I know about preventing infection? Hepatitis B If you have a higher risk for hepatitis B, you should be screened for this virus. Talk with your health care provider to find out if you are at risk for hepatitis B infection. Hepatitis C Testing is recommended for: Everyone born from [birth year] through 1965. Anyone with known risk factors for hepatitis C. Sexually transmitted infections (STIs) Get screened for STIs, including gonorrhea and chlamydia, if: You are sexually active and are younger than 35 years of age. You are older than 35 years of age and your health care provider tells you that you are at risk for this type of infection. Your sexual activity has changed since you were last screened, and you are at increased risk for chlamydia or gonorrhea. Ask your health care provider if you are at risk. Ask your health care provider about whether you are at high risk for HIV. Your  health care provider may recommend a prescription medicine to help prevent HIV  infection. If you choose to take medicine to prevent HIV, you should first get tested for HIV. You should then be tested every 3 months for as long as you are taking the medicine. Pregnancy If you are about to stop having your period (premenopausal) and you may become pregnant, seek counseling before you get pregnant. Take 400 to 800 micrograms (mcg) of folic acid every day if you become pregnant. Ask for birth control (contraception) if you want to prevent pregnancy. Osteoporosis and menopause Osteoporosis is a disease in which the bones lose minerals and strength with aging. This can result in bone fractures. If you are 24 years old or older, or if you are at risk for osteoporosis and fractures, ask your health care provider if you should: Be screened for bone loss. Take a calcium or vitamin D supplement to lower your risk of fractures. Be given hormone replacement therapy (HRT) to treat symptoms of menopause. Follow these instructions at home: Alcohol use Do not drink alcohol if: Your health care provider tells you not to drink. You are pregnant, may be pregnant, or are planning to become pregnant. If you drink alcohol: Limit how much you have to: 0-1 drink a day. Know how much alcohol is in your drink. In the [place] one drink equals one 12 oz bottle of beer (355 mL), one 5 oz glass of wine (148 mL), or one 1 oz glass of hard liquor (44 mL). Lifestyle Do not use any products that contain nicotine or tobacco. These products include cigarettes, chewing tobacco, and vaping devices, such as e-cigarettes. If you need help quitting, ask your health care provider. Do not use street drugs. Do not share needles. Ask your health care provider for help if you need support or information about quitting drugs. General instructions Schedule regular health, dental, and eye exams. Stay current with your vaccines. Tell your health care provider if: You often feel depressed. You have ever been abused  or do not feel safe at home. Summary Adopting a healthy lifestyle and getting preventive care are important in promoting health and wellness. Follow your health care provider's instructions about healthy diet, exercising, and getting tested or screened for diseases. Follow your health care provider's instructions on monitoring your cholesterol and blood pressure. This information is not intended to replace advice given to you by your health care provider. Make sure you discuss any questions you have with your health care provider. Document Revised: 07/05/2020 Document Reviewed: 07/05/2020 Elsevier Patient Education  2022 [place], MD [facility]

## 2021-01-06 NOTE — Patient Instructions (Signed)

## 2021-01-07 LAB — THYROID PANEL WITH TSH
Free Thyroxine Index: 2.6 (ref 1.4–3.8)
T3 Uptake: 24 % (ref 22–35)
T4, Total: 10.9 ug/dL (ref 5.1–11.9)
TSH: 0.91 mIU/L

## 2021-01-13 ENCOUNTER — Ambulatory Visit
Admission: RE | Admit: 2021-01-13 | Discharge: 2021-01-13 | Disposition: A | Discharge status: Home / Self Care | Payer: Medicare Other | Source: Ambulatory Visit | Attending: Emergency Medicine | Admitting: Emergency Medicine

## 2021-01-13 DIAGNOSIS — E049 Nontoxic goiter, unspecified: Secondary | ICD-10-CM

## 2021-01-27 ENCOUNTER — Encounter: Payer: Self-pay | Admitting: Emergency Medicine

## 2021-01-28 ENCOUNTER — Other Ambulatory Visit: Payer: Self-pay | Admitting: Emergency Medicine

## 2021-01-28 DIAGNOSIS — R051 Acute cough: Secondary | ICD-10-CM

## 2021-01-28 MED ORDER — PROMETHAZINE-DM 6.25-15 MG/5ML PO SYRP: 5.0000 mL | ORAL_SOLUTION | Freq: Four times a day (QID) | ORAL | 0 refills | Status: DC | PRN

## 2021-01-28 MED ORDER — BENZONATATE 200 MG PO CAPS: 200.0000 mg | ORAL_CAPSULE | Freq: Two times a day (BID) | ORAL | 0 refills | Status: DC | PRN

## 2021-01-28 NOTE — Telephone Encounter (Signed)
Over-the-counter Mucinex DM.  I will also send additional cough medications to her pharmacy of record.  This viral infection has to continue running its course. Thanks.

## 2021-04-11 DIAGNOSIS — F411 Generalized anxiety disorder: Secondary | ICD-10-CM | POA: Diagnosis not present

## 2021-04-11 DIAGNOSIS — F4010 Social phobia, unspecified: Secondary | ICD-10-CM | POA: Diagnosis not present

## 2021-04-11 DIAGNOSIS — F3132 Bipolar disorder, current episode depressed, moderate: Secondary | ICD-10-CM | POA: Diagnosis not present

## 2021-05-25 DIAGNOSIS — D2262 Melanocytic nevi of left upper limb, including shoulder: Secondary | ICD-10-CM | POA: Diagnosis not present

## 2021-05-25 DIAGNOSIS — D225 Melanocytic nevi of trunk: Secondary | ICD-10-CM | POA: Diagnosis not present

## 2021-05-25 DIAGNOSIS — D2271 Melanocytic nevi of right lower limb, including hip: Secondary | ICD-10-CM | POA: Diagnosis not present

## 2021-05-25 DIAGNOSIS — D2261 Melanocytic nevi of right upper limb, including shoulder: Secondary | ICD-10-CM | POA: Diagnosis not present

## 2021-05-30 DIAGNOSIS — F3132 Bipolar disorder, current episode depressed, moderate: Secondary | ICD-10-CM | POA: Diagnosis not present

## 2021-06-03 DIAGNOSIS — F3132 Bipolar disorder, current episode depressed, moderate: Secondary | ICD-10-CM | POA: Diagnosis not present

## 2021-07-12 DIAGNOSIS — F4010 Social phobia, unspecified: Secondary | ICD-10-CM | POA: Diagnosis not present

## 2021-07-12 DIAGNOSIS — F3132 Bipolar disorder, current episode depressed, moderate: Secondary | ICD-10-CM | POA: Diagnosis not present

## 2021-07-12 DIAGNOSIS — F411 Generalized anxiety disorder: Secondary | ICD-10-CM | POA: Diagnosis not present

## 2021-08-11 DIAGNOSIS — E538 Deficiency of other specified B group vitamins: Secondary | ICD-10-CM | POA: Diagnosis not present

## 2021-08-11 DIAGNOSIS — N943 Premenstrual tension syndrome: Secondary | ICD-10-CM | POA: Diagnosis not present

## 2021-08-11 DIAGNOSIS — Z3041 Encounter for surveillance of contraceptive pills: Secondary | ICD-10-CM | POA: Diagnosis not present

## 2021-08-12 DIAGNOSIS — F3132 Bipolar disorder, current episode depressed, moderate: Secondary | ICD-10-CM | POA: Diagnosis not present

## 2021-08-17 ENCOUNTER — Telehealth: Payer: Self-pay | Admitting: Emergency Medicine

## 2021-08-17 NOTE — Telephone Encounter (Signed)
Left message for patient to call back to schedule Medicare Annual Wellness Visit   No hx of AWV eligible as of 10/28/09  Please schedule at anytime with [facility] Advisor if patient calls the office back.    45 Minutes appointment   Any questions, please call me at [phone]

## 2021-08-24 ENCOUNTER — Ambulatory Visit (INDEPENDENT_AMBULATORY_CARE_PROVIDER_SITE_OTHER): Payer: Medicare Other

## 2021-08-24 VITALS — BP 112/70 | HR 65 | Resp 18 | Ht 69.0 in | Wt 150.8 lb

## 2021-08-24 DIAGNOSIS — Z0000 Encounter for general adult medical examination without abnormal findings: Secondary | ICD-10-CM

## 2021-08-24 NOTE — Progress Notes (Signed)
Subjective:   Amber Schultz is a 36 y.o. female who presents for Medicare Annual (Subsequent) preventive examination.  Review of Systems           Objective:    Today's Vitals   08/24/21 1308  BP: 112/70  Pulse: 65  Resp: 18  SpO2: 99%  Weight: 150 lb 12.8 oz (68.4 kg)  Height: 5\' 9"  (1.753 m)   Body mass index is 22.27 kg/m.     06/05/2020   12:27 AM  Advanced Directives  Does Patient Have a Medical Advance Directive? No  Would patient like information on creating a medical advance directive? No - Patient declined    Current Medications (verified) Outpatient Encounter Medications as of 08/24/2021  Medication Sig   drospirenone-ethinyl estradiol (YAZ,GIANVI,LORYNA) 3-0.02 MG tablet Take 1 tablet by mouth daily.   fluticasone (FLONASE) 50 MCG/ACT nasal spray Place 2 sprays into both nostrils daily.   lamoTRIgine (LAMICTAL) 100 MG tablet Take 100 mg by mouth 2 (two) times daily.   lithium carbonate 300 MG capsule Take 300 mg by mouth 3 (three) times daily with meals.   [DISCONTINUED] benzonatate (TESSALON) 200 MG capsule Take 1 capsule (200 mg total) by mouth 2 (two) times daily as needed for cough.   [DISCONTINUED] promethazine-dextromethorphan (PROMETHAZINE-DM) 6.25-15 MG/5ML syrup Take 5 mLs by mouth 4 (four) times daily as needed for cough.   No facility-administered encounter medications on file as of 08/24/2021.    Allergies (verified) Aleve [naproxen sodium], Aspirin, Ciprofloxacin hcl, Ibuprofen, Macrobid [nitrofurantoin monohyd macro], Penicillins, Sudafed [pseudoephedrine hcl], Sulfa antibiotics, and Tramadol   History: Past Medical History:  Diagnosis Date   Allergy    Anxiety    Bipolar 1 disorder (HCC)    Depression    Goiter    History reviewed. No pertinent surgical history. Family History  Problem Relation Age of Onset   Hypertension Mother    Cancer Mother        melanoma   Cancer Father        prostate   Bipolar disorder Father     Cancer Maternal Grandmother    Cancer Maternal Grandfather    Hypertension Paternal Grandmother    Cancer Paternal Grandfather    Social History   Socioeconomic History   Marital status: Single    Spouse name: Not on file   Number of children: Not on file   Years of education: Not on file   Highest education level: Not on file  Occupational History   Not on file  Tobacco Use   Smoking status: Never   Smokeless tobacco: Never  Substance and Sexual Activity   Alcohol use: No    Alcohol/week: 0.0 standard drinks of alcohol   Drug use: No   Sexual activity: Not Currently  Other Topics Concern   Not on file  Social History Narrative   Not on file   Social Determinants of Health   Financial Resource Strain: Low Risk  (08/24/2021)   Overall Financial Resource Strain (CARDIA)    Difficulty of Paying Living Expenses: Not hard at all  Food Insecurity: No Food Insecurity (08/24/2021)   Hunger Vital Sign    Worried About Running Out of Food in the Last Year: Never true    Ran Out of Food in the Last Year: Never true  Transportation Needs: No Transportation Needs (08/24/2021)   PRAPARE - 08/26/2021 (Medical): No    Lack of Transportation (Non-Medical): No  Physical Activity: Sufficiently  Active (08/24/2021)   Exercise Vital Sign    Days of Exercise per Week: 7 days    Minutes of Exercise per Session: 30 min  Stress: No Stress Concern Present (08/24/2021)   [employer] of Occupational Health - Occupational Stress Questionnaire    Feeling of Stress : Not at all  Social Connections: Moderately Integrated (08/24/2021)   Social Connection and Isolation Panel [NHANES]    Frequency of Communication with Friends and Family: More than three times a week    Frequency of Social Gatherings with Friends and Family: Three times a week    Attends Religious Services: 1 to 4 times per year    Active Member of Clubs or Organizations: Yes    Attends Tax inspector Meetings: More than 4 times per year    Marital Status: Never married    Tobacco Counseling Counseling given: Not Answered   Clinical Intake:              How often do you need to have someone help you when you read instructions, pamphlets, or other written materials from your doctor or pharmacy?: (P) 1 - Never  Diabetic?No         Activities of Daily Living    08/24/2021   12:41 PM  In your present state of health, do you have any difficulty performing the following activities:  Hearing? 0  Vision? 0  Difficulty concentrating or making decisions? 0  Walking or climbing stairs? 0  Dressing or bathing? 0  Doing errands, shopping? 0  Preparing Food and eating ? N  Using the Toilet? N  In the past six months, have you accidently leaked urine? N  Do you have problems with loss of bowel control? N  Managing your Medications? N  Managing your Finances? N  Housekeeping or managing your Housekeeping? N    Patient Care Team: Georgina Quint, MD as PCP - General (Internal Medicine)  Indicate any recent Medical Services you may have received from other than [facility] providers in the past year (date may be approximate).     Assessment:   This is a routine wellness examination for Amber Schultz.  Hearing/Vision screen No results found.  Dietary issues and exercise activities discussed:     Goals Addressed   None   Depression Screen    08/24/2021    1:35 PM 01/06/2021   10:25 AM 06/08/2020   10:31 AM 05/03/2018    3:17 PM 01/01/2018    1:47 PM 10/31/2017    8:48 AM 08/02/2017   11:27 AM  PHQ 2/9 Scores  PHQ - 2 Score 0 0 0 0 0 0 0  PHQ- 9 Score 0          Fall Risk    08/24/2021    1:37 PM 08/24/2021   12:41 PM 01/06/2021   10:24 AM 06/08/2020   10:31 AM 06/04/2020   11:30 AM  Fall Risk   Falls in the past year? 0 0 0 0 0  Number falls in past yr: 0 0 0  0  Injury with Fall? 0 0 0  0  Risk for fall due to : No Fall Risks   No Fall Risks    Follow up    Falls evaluation completed Falls evaluation completed    FALL RISK PREVENTION PERTAINING TO THE HOME:  Any stairs in or around the home? No If so, are there any without handrails? No  Home free of loose throw rugs in walkways,  pet beds, electrical cords, etc? Yes  Adequate lighting in your home to reduce risk of falls? Yes   ASSISTIVE DEVICES UTILIZED TO PREVENT FALLS:  Life alert? No  Use of a cane, walker or w/c? No  Grab bars in the bathroom? No  Shower chair or bench in shower? No  Elevated toilet seat or a handicapped toilet? No   TIMED UP AND GO:  Was the test performed? No .  Length of time to ambulate 10 feet:  sec.   Gait steady and fast without use of assistive device  Cognitive Function:        08/24/2021    1:37 PM  6CIT Screen  What Year? 0 points  What month? 0 points  What time? 0 points  Count back from 20 0 points  Months in reverse 0 points  Repeat phrase 0 points  Total Score 0 points    Immunizations Immunization History  Administered Date(s) Administered   Influenza Inj Mdck Quad With Preservative 01/30/2018   PFIZER(Purple Top)SARS-COV-2 Vaccination 06/07/2019, 07/01/2019   Tdap 04/03/2017    TDAP status: Up to date  Flu Vaccine status: Declined, Education has been provided regarding the importance of this vaccine but patient still declined. Advised may receive this vaccine at local pharmacy or Health Dept. Aware to provide a copy of the vaccination record if obtained from local pharmacy or Health Dept. Verbalized acceptance and understanding.  Pneumococcal vaccine status: Declined,  Education has been provided regarding the importance of this vaccine but patient still declined. Advised may receive this vaccine at local pharmacy or Health Dept. Aware to provide a copy of the vaccination record if obtained from local pharmacy or Health Dept. Verbalized acceptance and understanding.   Covid-19 vaccine status: Information  provided on how to obtain vaccines.   Qualifies for Shingles Vaccine? No   Zostavax completed No   Shingrix Completed?: No.    Education has been provided regarding the importance of this vaccine. Patient has been advised to call insurance company to determine out of pocket expense if they have not yet received this vaccine. Advised may also receive vaccine at local pharmacy or Health Dept. Verbalized acceptance and understanding.  Screening Tests Health Maintenance  Topic Date Due   Hepatitis C Screening  Never done   COVID-19 Vaccine (3 - Pfizer series) 08/26/2019   PAP SMEAR-Modifier  03/18/2020   INFLUENZA VACCINE  09/27/2021   TETANUS/TDAP  04/04/2027   HIV Screening  Completed   HPV VACCINES  Aged Out    Health Maintenance  Health Maintenance Due  Topic Date Due   Hepatitis C Screening  Never done   COVID-19 Vaccine (3 - Pfizer series) 08/26/2019   PAP SMEAR-Modifier  03/18/2020    Colorectal screening. Patient is 36 years old. Not required yet  Mammogram. Patient declined     Lung Cancer Screening: (Low Dose CT Chest recommended if Age 61-80 years, 30 pack-year currently smoking OR have quit w/in 15years.) does not qualify.   Lung Cancer Screening Referral: N/A  Additional Screening:  Hepatitis C Screening: does not qualify; Completed N/A  Vision Screening: Recommended annual ophthalmology exams for early detection of glaucoma and other disorders of the eye. Is the patient up to date with their annual eye exam?  Yes  Who is the provider or what is the name of the office in which the patient attends annual eye exams? Yes If pt is not established with a provider, would they like to be referred to a  provider to establish care? Yes .   Dental Screening: Recommended annual dental exams for proper oral hygiene  Community Resource Referral / Chronic Care Management: CRR required this visit?  No   CCM required this visit?  No      Plan:     I have personally  reviewed and noted the following in the patient's chart:   Medical and social history Use of alcohol, tobacco or illicit drugs  Current medications and supplements including opioid prescriptions.  Functional ability and status Nutritional status Physical activity Advanced directives List of other physicians Hospitalizations, surgeries, and ER visits in previous 12 months Vitals Screenings to include cognitive, depression, and falls Referrals and appointments  In addition, I have reviewed and discussed with patient certain preventive protocols, quality metrics, and best practice recommendations. A written personalized care plan for preventive services as well as general preventive health recommendations were provided to patient.     Manuela Schwartz, [place]   08/24/2021   Nurse Notes: Patient was concerned about her energy levels. Her gyn did test her levels and stated that she was a "low normal". She would like to know whether or not she should be taking a b12 supplement.

## 2021-08-24 NOTE — Patient Instructions (Signed)
It was great seeing you in office today.   Please schedule your pap smear with your gyn.   Please follow up in 1 year for your medicare wellness visit

## 2021-09-13 DIAGNOSIS — F319 Bipolar disorder, unspecified: Secondary | ICD-10-CM | POA: Diagnosis not present

## 2021-09-13 DIAGNOSIS — F411 Generalized anxiety disorder: Secondary | ICD-10-CM | POA: Diagnosis not present

## 2021-11-10 DIAGNOSIS — Z01419 Encounter for gynecological examination (general) (routine) without abnormal findings: Secondary | ICD-10-CM | POA: Diagnosis not present

## 2021-11-10 DIAGNOSIS — Z113 Encounter for screening for infections with a predominantly sexual mode of transmission: Secondary | ICD-10-CM | POA: Diagnosis not present

## 2021-11-10 DIAGNOSIS — Z124 Encounter for screening for malignant neoplasm of cervix: Secondary | ICD-10-CM | POA: Diagnosis not present

## 2021-11-10 DIAGNOSIS — Z1151 Encounter for screening for human papillomavirus (HPV): Secondary | ICD-10-CM | POA: Diagnosis not present

## 2021-11-11 DIAGNOSIS — F319 Bipolar disorder, unspecified: Secondary | ICD-10-CM | POA: Diagnosis not present

## 2021-11-11 DIAGNOSIS — F4010 Social phobia, unspecified: Secondary | ICD-10-CM | POA: Diagnosis not present

## 2021-11-11 DIAGNOSIS — F411 Generalized anxiety disorder: Secondary | ICD-10-CM | POA: Diagnosis not present

## 2021-12-07 DIAGNOSIS — H52203 Unspecified astigmatism, bilateral: Secondary | ICD-10-CM | POA: Diagnosis not present

## 2022-04-11 DIAGNOSIS — F411 Generalized anxiety disorder: Secondary | ICD-10-CM | POA: Diagnosis not present

## 2022-04-11 DIAGNOSIS — F4010 Social phobia, unspecified: Secondary | ICD-10-CM | POA: Diagnosis not present

## 2022-04-11 DIAGNOSIS — F319 Bipolar disorder, unspecified: Secondary | ICD-10-CM | POA: Diagnosis not present

## 2022-04-27 DIAGNOSIS — M9905 Segmental and somatic dysfunction of pelvic region: Secondary | ICD-10-CM | POA: Diagnosis not present

## 2022-04-27 DIAGNOSIS — M9903 Segmental and somatic dysfunction of lumbar region: Secondary | ICD-10-CM | POA: Diagnosis not present

## 2022-04-27 DIAGNOSIS — M5137 Other intervertebral disc degeneration, lumbosacral region: Secondary | ICD-10-CM | POA: Diagnosis not present

## 2022-04-27 DIAGNOSIS — S39012A Strain of muscle, fascia and tendon of lower back, initial encounter: Secondary | ICD-10-CM | POA: Diagnosis not present

## 2022-05-01 DIAGNOSIS — S39012A Strain of muscle, fascia and tendon of lower back, initial encounter: Secondary | ICD-10-CM | POA: Diagnosis not present

## 2022-05-01 DIAGNOSIS — M9905 Segmental and somatic dysfunction of pelvic region: Secondary | ICD-10-CM | POA: Diagnosis not present

## 2022-05-01 DIAGNOSIS — M5137 Other intervertebral disc degeneration, lumbosacral region: Secondary | ICD-10-CM | POA: Diagnosis not present

## 2022-05-01 DIAGNOSIS — M9903 Segmental and somatic dysfunction of lumbar region: Secondary | ICD-10-CM | POA: Diagnosis not present

## 2022-05-03 DIAGNOSIS — M5137 Other intervertebral disc degeneration, lumbosacral region: Secondary | ICD-10-CM | POA: Diagnosis not present

## 2022-05-03 DIAGNOSIS — S39012A Strain of muscle, fascia and tendon of lower back, initial encounter: Secondary | ICD-10-CM | POA: Diagnosis not present

## 2022-05-03 DIAGNOSIS — M9905 Segmental and somatic dysfunction of pelvic region: Secondary | ICD-10-CM | POA: Diagnosis not present

## 2022-05-03 DIAGNOSIS — M9903 Segmental and somatic dysfunction of lumbar region: Secondary | ICD-10-CM | POA: Diagnosis not present

## 2022-05-09 DIAGNOSIS — M9903 Segmental and somatic dysfunction of lumbar region: Secondary | ICD-10-CM | POA: Diagnosis not present

## 2022-05-09 DIAGNOSIS — M9905 Segmental and somatic dysfunction of pelvic region: Secondary | ICD-10-CM | POA: Diagnosis not present

## 2022-05-09 DIAGNOSIS — S39012A Strain of muscle, fascia and tendon of lower back, initial encounter: Secondary | ICD-10-CM | POA: Diagnosis not present

## 2022-05-09 DIAGNOSIS — M5137 Other intervertebral disc degeneration, lumbosacral region: Secondary | ICD-10-CM | POA: Diagnosis not present

## 2022-05-11 DIAGNOSIS — M9903 Segmental and somatic dysfunction of lumbar region: Secondary | ICD-10-CM | POA: Diagnosis not present

## 2022-05-11 DIAGNOSIS — M9905 Segmental and somatic dysfunction of pelvic region: Secondary | ICD-10-CM | POA: Diagnosis not present

## 2022-05-11 DIAGNOSIS — M5137 Other intervertebral disc degeneration, lumbosacral region: Secondary | ICD-10-CM | POA: Diagnosis not present

## 2022-05-11 DIAGNOSIS — S39012A Strain of muscle, fascia and tendon of lower back, initial encounter: Secondary | ICD-10-CM | POA: Diagnosis not present

## 2022-05-23 DIAGNOSIS — M9905 Segmental and somatic dysfunction of pelvic region: Secondary | ICD-10-CM | POA: Diagnosis not present

## 2022-05-23 DIAGNOSIS — S39012A Strain of muscle, fascia and tendon of lower back, initial encounter: Secondary | ICD-10-CM | POA: Diagnosis not present

## 2022-05-23 DIAGNOSIS — M5137 Other intervertebral disc degeneration, lumbosacral region: Secondary | ICD-10-CM | POA: Diagnosis not present

## 2022-05-23 DIAGNOSIS — M9903 Segmental and somatic dysfunction of lumbar region: Secondary | ICD-10-CM | POA: Diagnosis not present

## 2022-06-06 IMAGING — CT CT RENAL STONE PROTOCOL
2 of 4 series · 16 of 46 positions shown, 18 images · non-contrast
Comparison: None.

CLINICAL DATA: Hematuria of unknown cause

EXAM:
CT ABDOMEN AND PELVIS WITHOUT CONTRAST
TECHNIQUE: Multidetector CT imaging of the abdomen and pelvis was performed
following the standard protocol without IV contrast.

[Series 2: axial st · axial · 0.75mm/px · z∈[-450,-35]mm · 13 of 93 slices shown, 15 images]
[im 5/93  soft-tissue]
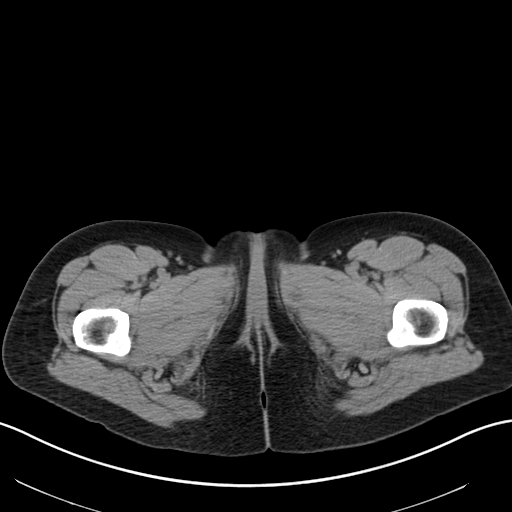
[im 5/93  bone]
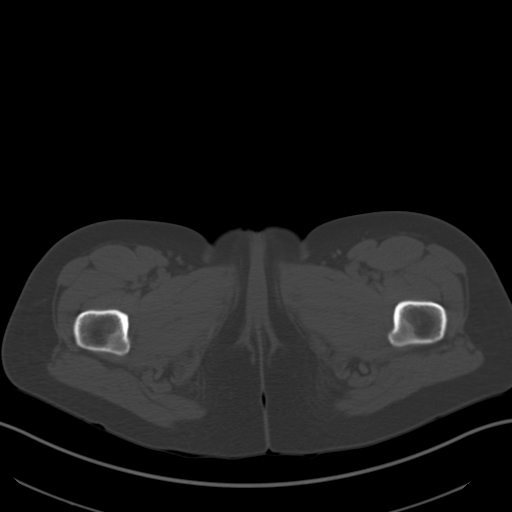
[im 14/93  soft-tissue]
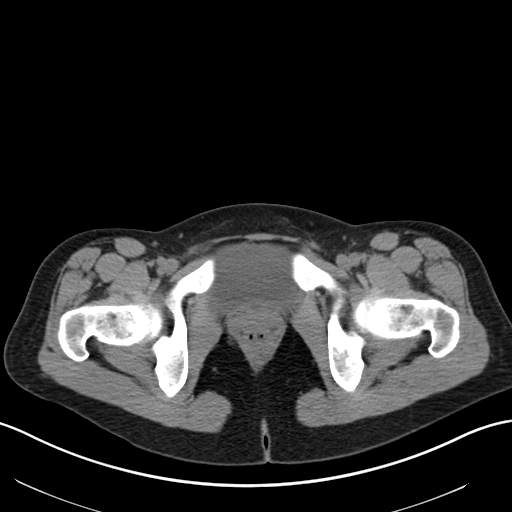
[im 18/93  soft-tissue]
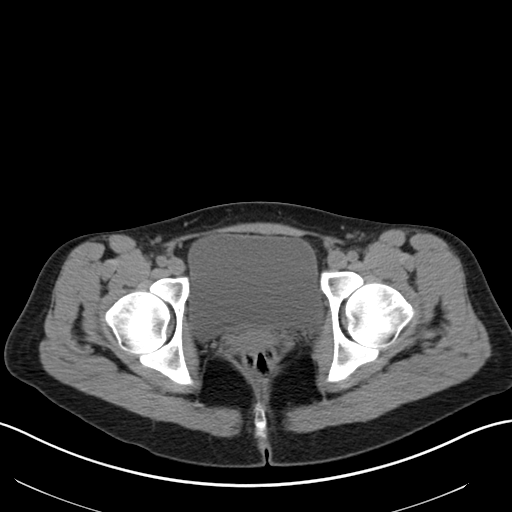
[im 27/93  soft-tissue]
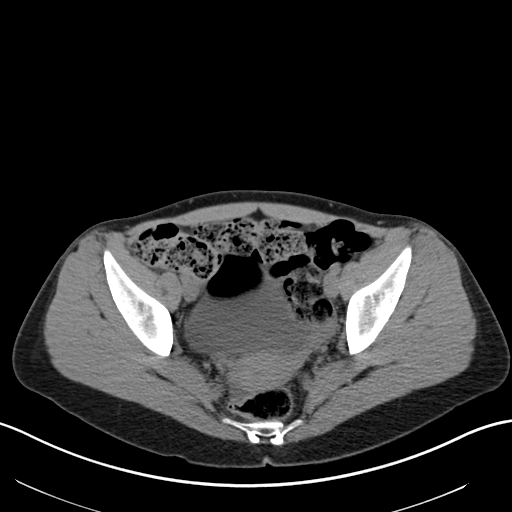
[im 31/93  soft-tissue]
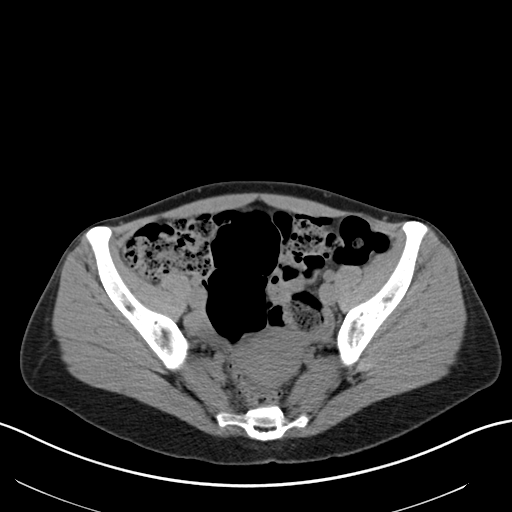
[im 40/93  soft-tissue]
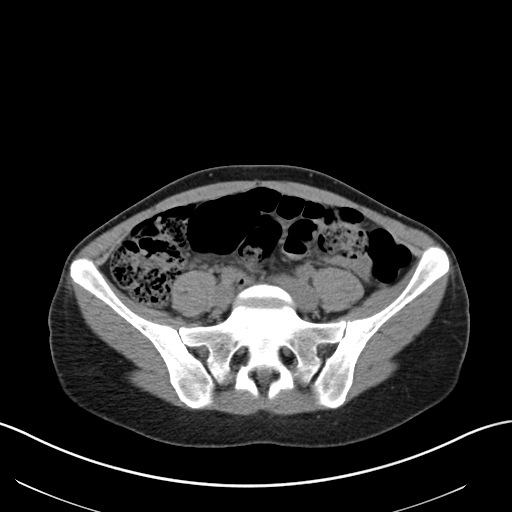
[im 49/93  soft-tissue]
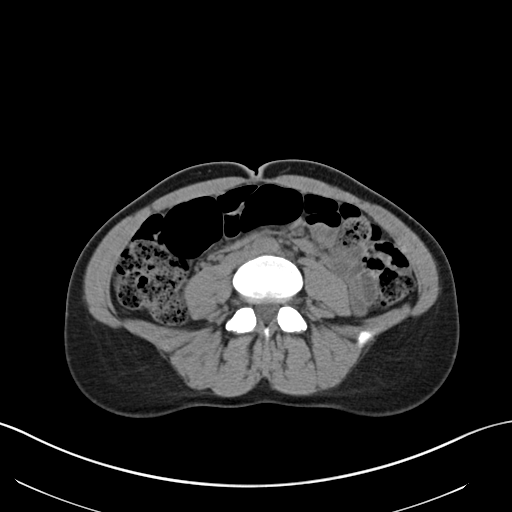
[im 53/93  soft-tissue]
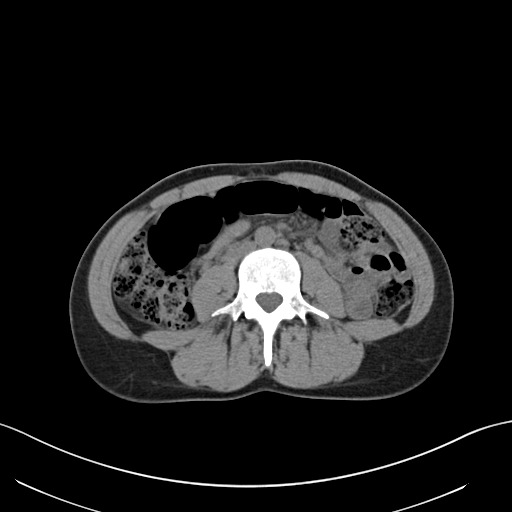
[im 62/93  soft-tissue]
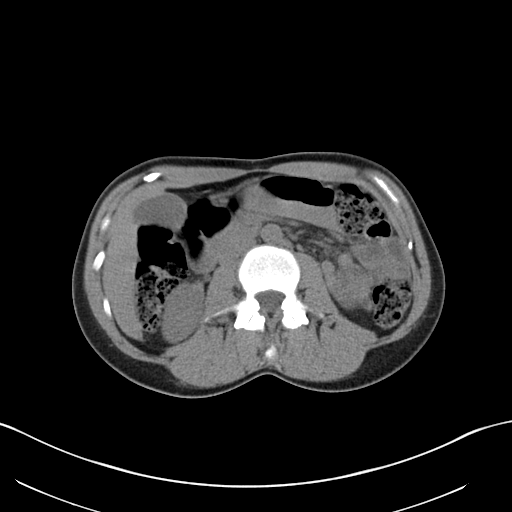
[im 62/93  bone]
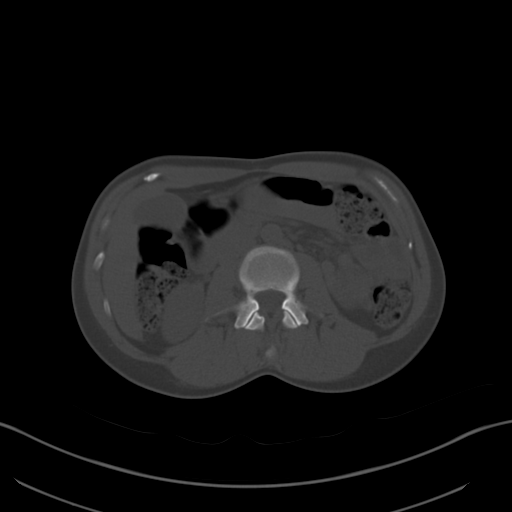
[im 66/93  soft-tissue]
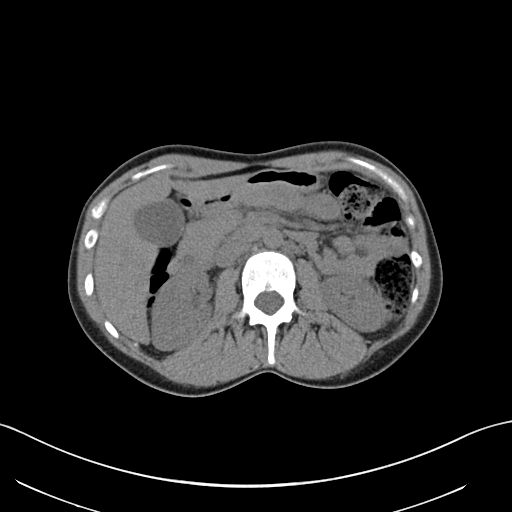
[im 75/93  soft-tissue]
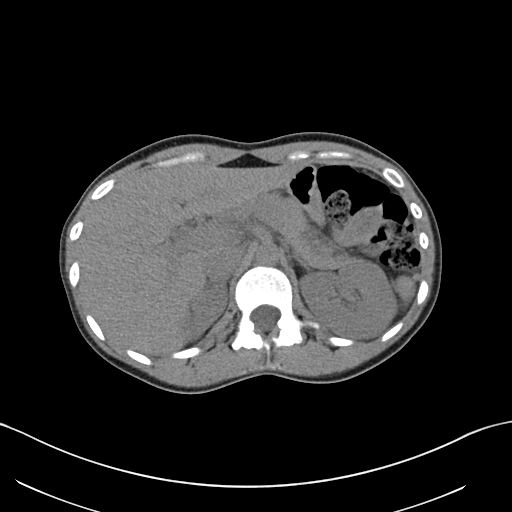
[im 79/93  soft-tissue]
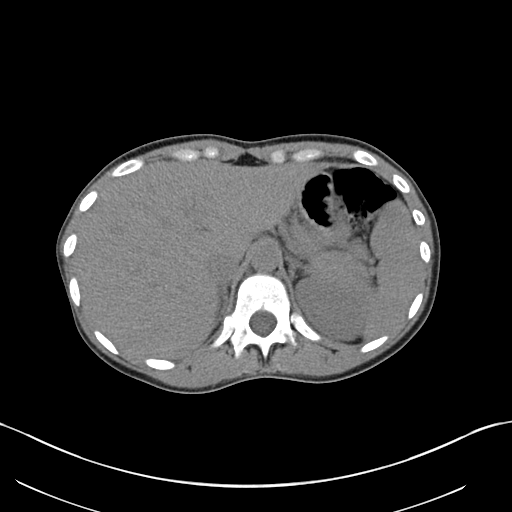
[im 88/93  soft-tissue]
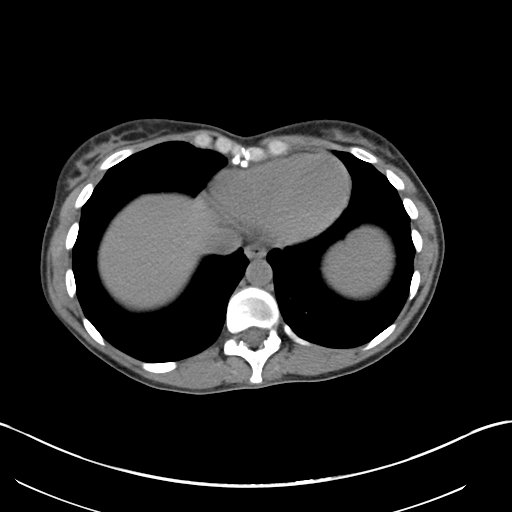

[Series 5: coronal · coronal · 0.69mm/px · 3 of 144 slices shown]
[im 48/144  soft-tissue]
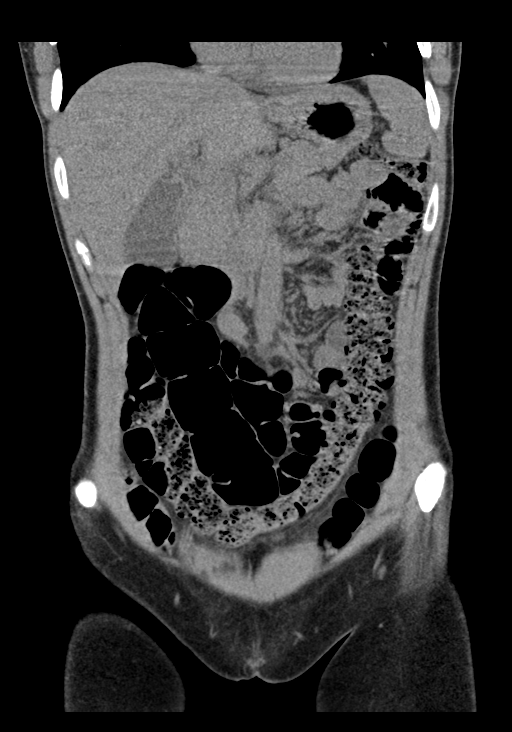
[im 64/144  soft-tissue]
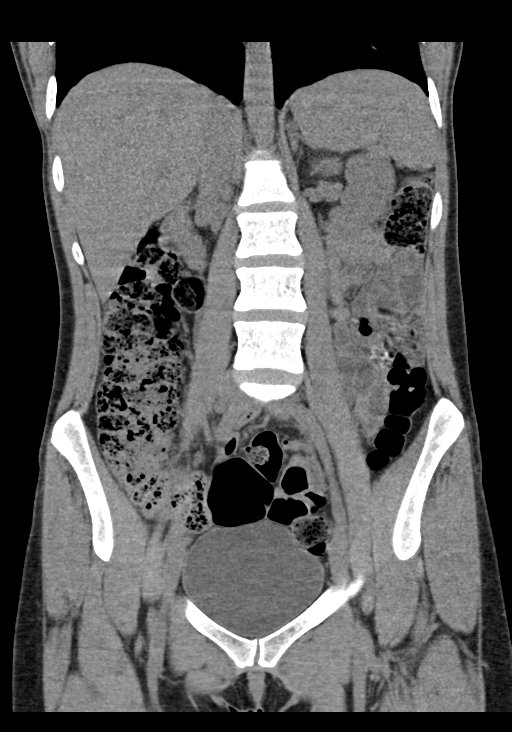
[im 80/144  soft-tissue]
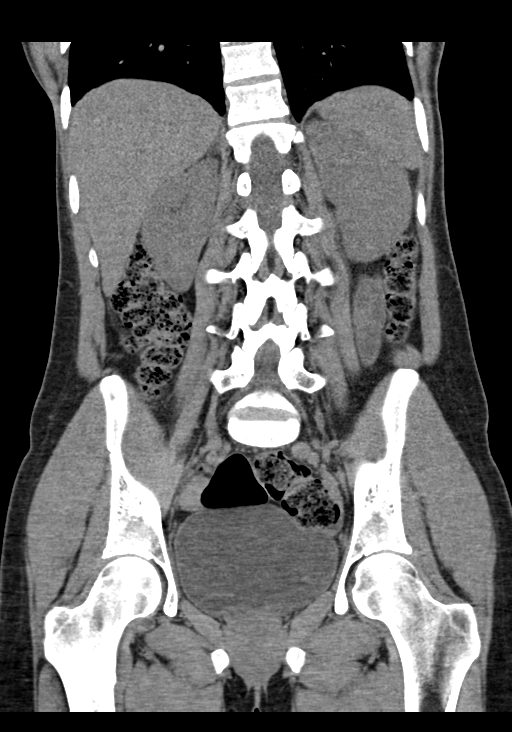

[16 of 46 positions shown; findings below may reference images not displayed]

FINDINGS: Lower chest:  No contributory findings.

Hepatobiliary: No focal liver abnormality.No evidence of biliary
obstruction or stone.

Pancreas: Unremarkable.

Spleen: Unremarkable.

Adrenals/Urinary Tract: Negative adrenals. No hydronephrosis or
stone. Unremarkable bladder.

Stomach/Bowel: No obstruction. No appendicitis. Generalized colonic
stool which is incidental based on the provided history.

Vascular/Lymphatic: No acute vascular abnormality. No mass or
adenopathy.

Reproductive:Nabothian cyst appearance.

Other: Trace pelvic fluid, usually physiologic in this setting.

Musculoskeletal: No acute abnormalities.
IMPRESSION: Negative CT.  No urinary calculi.

## 2022-06-15 DIAGNOSIS — S39012A Strain of muscle, fascia and tendon of lower back, initial encounter: Secondary | ICD-10-CM | POA: Diagnosis not present

## 2022-06-15 DIAGNOSIS — M5137 Other intervertebral disc degeneration, lumbosacral region: Secondary | ICD-10-CM | POA: Diagnosis not present

## 2022-06-15 DIAGNOSIS — M9905 Segmental and somatic dysfunction of pelvic region: Secondary | ICD-10-CM | POA: Diagnosis not present

## 2022-06-15 DIAGNOSIS — M9903 Segmental and somatic dysfunction of lumbar region: Secondary | ICD-10-CM | POA: Diagnosis not present

## 2022-06-20 DIAGNOSIS — M5137 Other intervertebral disc degeneration, lumbosacral region: Secondary | ICD-10-CM | POA: Diagnosis not present

## 2022-06-20 DIAGNOSIS — M9905 Segmental and somatic dysfunction of pelvic region: Secondary | ICD-10-CM | POA: Diagnosis not present

## 2022-06-20 DIAGNOSIS — S39012A Strain of muscle, fascia and tendon of lower back, initial encounter: Secondary | ICD-10-CM | POA: Diagnosis not present

## 2022-06-20 DIAGNOSIS — M9903 Segmental and somatic dysfunction of lumbar region: Secondary | ICD-10-CM | POA: Diagnosis not present

## 2022-07-07 DIAGNOSIS — D2262 Melanocytic nevi of left upper limb, including shoulder: Secondary | ICD-10-CM | POA: Diagnosis not present

## 2022-07-07 DIAGNOSIS — D224 Melanocytic nevi of scalp and neck: Secondary | ICD-10-CM | POA: Diagnosis not present

## 2022-07-07 DIAGNOSIS — L603 Nail dystrophy: Secondary | ICD-10-CM | POA: Diagnosis not present

## 2022-07-07 DIAGNOSIS — D2261 Melanocytic nevi of right upper limb, including shoulder: Secondary | ICD-10-CM | POA: Diagnosis not present

## 2022-07-10 DIAGNOSIS — E538 Deficiency of other specified B group vitamins: Secondary | ICD-10-CM | POA: Diagnosis not present

## 2022-07-10 DIAGNOSIS — F319 Bipolar disorder, unspecified: Secondary | ICD-10-CM | POA: Diagnosis not present

## 2022-07-10 DIAGNOSIS — F4010 Social phobia, unspecified: Secondary | ICD-10-CM | POA: Diagnosis not present

## 2022-07-10 DIAGNOSIS — F411 Generalized anxiety disorder: Secondary | ICD-10-CM | POA: Diagnosis not present

## 2022-07-11 DIAGNOSIS — M25551 Pain in right hip: Secondary | ICD-10-CM | POA: Diagnosis not present

## 2022-07-13 DIAGNOSIS — M5137 Other intervertebral disc degeneration, lumbosacral region: Secondary | ICD-10-CM | POA: Diagnosis not present

## 2022-07-13 DIAGNOSIS — M9903 Segmental and somatic dysfunction of lumbar region: Secondary | ICD-10-CM | POA: Diagnosis not present

## 2022-07-13 DIAGNOSIS — M9905 Segmental and somatic dysfunction of pelvic region: Secondary | ICD-10-CM | POA: Diagnosis not present

## 2022-07-13 DIAGNOSIS — S39012A Strain of muscle, fascia and tendon of lower back, initial encounter: Secondary | ICD-10-CM | POA: Diagnosis not present

## 2022-08-01 ENCOUNTER — Ambulatory Visit (INDEPENDENT_AMBULATORY_CARE_PROVIDER_SITE_OTHER): Payer: Medicare Other | Admitting: Physical Therapy

## 2022-08-01 ENCOUNTER — Encounter: Payer: Self-pay | Admitting: Physical Therapy

## 2022-08-01 DIAGNOSIS — M25551 Pain in right hip: Secondary | ICD-10-CM | POA: Diagnosis not present

## 2022-08-01 DIAGNOSIS — M6281 Muscle weakness (generalized): Secondary | ICD-10-CM | POA: Diagnosis not present

## 2022-08-01 DIAGNOSIS — R262 Difficulty in walking, not elsewhere classified: Secondary | ICD-10-CM

## 2022-08-01 NOTE — Therapy (Signed)
OUTPATIENT PHYSICAL THERAPY LOWER EXTREMITY EVALUATION   Patient Name: Amber Schultz MRN: [id number] DOB:May 14, 1985, 37 y.o., female Today's Date: 08/01/2022  END OF SESSION:  PT End of Session - 08/01/22 1259     Visit Number 1    Number of Visits 16    Date for PT Re-Evaluation 10/24/22    Authorization Type BCBS medicare    PT Start Time 1300    PT Stop Time 1347    PT Time Calculation (min) 47 min    Activity Tolerance Patient limited by pain    Behavior During Therapy [facility] for tasks assessed/performed             Past Medical History:  Diagnosis Date   Allergy    Anxiety    Bipolar 1 disorder (HCC)    Depression    Goiter    History reviewed. No pertinent surgical history. Patient Active Problem List   Diagnosis Date Noted   Neck pain 08/02/2017   Musculoskeletal pain 08/02/2017   Muscle spasm 08/02/2017   Anxiety 05/18/2015   Bipolar I disorder (HCC) 10/10/2012    PCP:  Georgina Quint, MD   REFERRING PROVIDER:  Rodolph Bong, MD- [facility]  REFERRING DIAG: [id number] (ICD-10-CM) - Pain in joint of right hip   THERAPY DIAG:  Pain in right hip  Muscle weakness (generalized)  Difficulty in walking, not elsewhere classified  Rationale for Evaluation and Treatment: Rehabilitation  ONSET DATE: a year ago  SUBJECTIVE:   SUBJECTIVE STATEMENT: Patient reports that her right hip has been bothering her for over a year.  It is started to hurt when she is performing pure barre classes and she is unable to do most the moves but they did in class.  Hip continued to bother her even with modification so she stopped going to barre classes.  She tried going to a chiropractor which helped with her mid back pain but did not help with her hip pain.  In fact manipulations of hip actually increased hip pain afterwards.  Reports she likes to be very active and likes to walk her dog and hike.  However her dog is younger and does tend to pull and she does not  know if this is contributing to her current issue.  Her last hike was in [place] in snow and ice which she wore snow shoes and cramps and afterwards she was in a lots of pain.  Oddly a few weeks ago she was in [place] and walked over 10 miles a day and had absolutely no pain during or afterwards.  She is heading to [place] July 1 and will be backpacking for a week and then exploring the country for another week afterwards and does not want to be in debilitating pain during her trip.  In addition to the pain in her hip she has right foot numbness that comes on when she has right hip pain.  Numbness does not extend through the leg and is isolated in the entire foot.  She tends to hold her leg up a lot in a sitting position as this helps alleviate her symptoms.  PERTINENT HISTORY: Mild scoliosis, bipolar PAIN:  Are you having pain? Yes: NPRS scale: 4, at worse and more broad spread/10 Pain location: right hip into leg Pain description: pulling, numbness, tight Aggravating factors: pure barre, walking, sleeping on right side, hiking Relieving factors: Unsure  PRECAUTIONS: None  WEIGHT BEARING RESTRICTIONS: No  FALLS:  Has patient fallen in last 6 months?  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  In sitting she leans to the left and holds up her right leg in passive hip flexion  PALPATION: Tenderness to palpation and increased resting tone noted in right glutes and left lumbar paraspinals, no pain with spring testing    LE Measurements Lower  Extremity Right EVAL Left EVAL   A/PROM MMT A/PROM MMT  Hip Flexion WFL 3* WFL 4  Hip Extension  3+*  4-  Hip Abduction 15* (AROM) 3* 30 4-  Hip Adduction  3*  4-  Hip Internal rotation [facility]  [facility]   Hip External rotation WF  WFL   Knee Flexion  3*  4  Knee Extension  3+  4  Ankle Dorsiflexion  4+  4+  Ankle Plantarflexion      Ankle Inversion      Ankle Eversion       (Blank rows = not tested) * pain   LOWER EXTREMITY SPECIAL TESTS:  Slump and SLR neg B Step down test - valgus, hip and trunk collapse B with R>L (right does not hip hinge at all) Walking- uses groin muscles to pull legs forward with hips in ER - wide BOS - excessive lumbar extension   TODAY'S TREATMENT:                                                                                                                              DATE:   08/01/2022  Therapeutic Exercise:  Aerobic: Supine: bridge with red band x25, hip add iso - one leg at a time 2 minutes total with 5" holds B Prone:  Seated:  Standing: Neuromuscular Re-education: Manual Therapy: Therapeutic Activity: Self Care: Trigger Point Dry Needling:  Modalities:    PATIENT EDUCATION:  Education details: on current presentation, on HEP, on clinical outcomes score and POC Person educated: Patient Education method: Explanation, Demonstration, and Handouts Education comprehension: verbalized understanding   HOME EXERCISE PROGRAM: [id number]  ASSESSMENT:  CLINICAL IMPRESSION: Patient presents to skilled physical therapy with complaints of chronic right hip pain that has worsened over the last year.  Patient was performing barre classes but this was painful and is not currently performing any strength training exercises at this time.  Despite this patient is highly active walking her dog and hiking up to 10 miles at a time.  These activities are painful especially afterwards which is concerning as patient has backpacking trip to [place] in July  of this  year.  Patient demonstrates significant weakness and instability throughout lower extremities and trunk and would greatly benefit from skilled physical therapy at this time to improve overall function and reduce risk of further injury.  OBJECTIVE IMPAIRMENTS: decreased activity tolerance, decreased balance, difficulty walking, decreased ROM, decreased strength, improper body mechanics, postural dysfunction, and pain.   ACTIVITY LIMITATIONS: standing, squatting, sleeping, stairs, and locomotion level  PARTICIPATION LIMITATIONS: cleaning, shopping, and community activity  PERSONAL FACTORS: Fitness and Time since onset of injury/illness/exacerbation are also affecting patient's functional outcome.   REHAB POTENTIAL: Good  CLINICAL DECISION MAKING: Stable/uncomplicated  EVALUATION COMPLEXITY: Low   GOALS: Goals reviewed with patient? yes  SHORT TERM GOALS: Target date: 09/12/2022  Patient will be independent in self management strategies to improve quality of life and functional outcomes. Baseline: New Program Goal status: INITIAL  2.  Patient will report at least 50% improvement in overall symptoms and/or function to demonstrate improved functional mobility Baseline: 0% better Goal status: INITIAL  3.  Patient will be able to perform side-lying hip abduction at wall without pain to demonstrate improved hip function. Baseline: Painful unable Goal status: INITIAL  4.  Patient will be able to demonstrate hip hinge with good form and no pain. Baseline: Unable. Goal status: INITIAL    LONG TERM GOALS: Target date: 10/24/2022   Patient will report at least 75% improvement in overall symptoms and/or function to demonstrate improved functional mobility Baseline: 0% better Goal status: INITIAL  2.  Patient will improve score on FOTO outcomes measure to projected score to demonstrate overall improved function and QOL. Baseline: see above Goal status: INITIAL  3.  Patient was able to  perform step down test with good control of hip and knee to reduce strain on lower extremity improve ability to hike up in the mountains. Baseline: Poor control bilaterally right greater than left Goal status: INITIAL  4.  Patient will demonstrate at least 4/5 MMT strength in bilateral lower extremities Baseline: See above Goal status: INITIAL    PLAN:  PT FREQUENCY: 1-2x/week for total of 16 visits over 12 week certifcation  PT DURATION: 12 weeks  PLANNED INTERVENTIONS: Therapeutic exercises, Therapeutic activity, Neuromuscular re-education, Balance training, Gait training, Patient/Family education, Self Care, Joint mobilization, Joint manipulation, Stair training, Vestibular training, Canalith repositioning, Orthotic/Fit training, Prosthetic training, DME instructions, Aquatic Therapy, Dry Needling, Electrical stimulation, Spinal manipulation, Spinal mobilization, Cryotherapy, Moist heat, Taping, Traction, Ultrasound, Ionotophoresis 4mg /ml Dexamethasone, Manual therapy, and Re-evaluation.   PLAN FOR NEXT SESSION: hip iso, stability, core work (breathing- lower rib decompression) hip hinge, pain management strategies, sit bones and posture   3:09 PM, 08/01/22 Tereasa Coop, DPT Physical Therapy with [facility]

## 2022-08-03 ENCOUNTER — Ambulatory Visit (INDEPENDENT_AMBULATORY_CARE_PROVIDER_SITE_OTHER): Payer: Medicare Other | Admitting: Physical Therapy

## 2022-08-03 ENCOUNTER — Encounter: Payer: Self-pay | Admitting: Physical Therapy

## 2022-08-03 DIAGNOSIS — M6281 Muscle weakness (generalized): Secondary | ICD-10-CM | POA: Diagnosis not present

## 2022-08-03 DIAGNOSIS — M25551 Pain in right hip: Secondary | ICD-10-CM

## 2022-08-03 DIAGNOSIS — R262 Difficulty in walking, not elsewhere classified: Secondary | ICD-10-CM | POA: Diagnosis not present

## 2022-08-03 NOTE — Therapy (Signed)
OUTPATIENT PHYSICAL THERAPY LOWER EXTREMITY TREATMENT   Patient Name: Amber Schultz MRN: [id number] DOB:08/07/1985, 37 y.o., female Today's Date: 08/03/2022  END OF SESSION:  PT End of Session - 08/03/22 1522     Visit Number 2    Number of Visits 16    Date for PT Re-Evaluation 10/24/22    Authorization Type [employer]BS medicare    PT Start Time 1522    PT Stop Time 1615    PT Time Calculation (min) 53 min    Activity Tolerance Patient limited by pain    Behavior During Therapy [facility] for tasks assessed/performed              Past Medical History:  Diagnosis Date   Allergy    Anxiety    Bipolar 1 disorder (HCC)    Depression    Goiter    History reviewed. No pertinent surgical history. Patient Active Problem List   Diagnosis Date Noted   Neck pain 08/02/2017   Musculoskeletal pain 08/02/2017   Muscle spasm 08/02/2017   Anxiety 05/18/2015   Bipolar I disorder (HCC) 10/10/2012    PCP:  Georgina Quint, MD   REFERRING PROVIDER:  Rodolph Bong, MD- [facility]  REFERRING DIAG: [phone] (ICD-10-CM) - Pain in joint of right hip   THERAPY DIAG:  Pain in right hip  Muscle weakness (generalized)  Difficulty in walking, not elsewhere classified  Rationale for Evaluation and Treatment: Rehabilitation  ONSET DATE: a year ago  SUBJECTIVE:   SUBJECTIVE STATEMENT: 08/03/2022 No pian no difficulties with exercises.  Eval: Patient reports that her right hip has been bothering her for over a year.  It is started to hurt when she is performing pure barre classes and she is unable to do most the moves but they did in class.  Hip continued to bother her even with modification so she stopped going to barre classes.  She tried going to a chiropractor which helped with her mid back pain but did not help with her hip pain.  In fact manipulations of hip actually increased hip pain afterwards.  Reports she likes to be very active and likes to walk her dog and hike.  However  her dog is younger and does tend to pull and she does not know if this is contributing to her current issue.  Her last hike was in [place] in snow and ice which she wore snow shoes and cramps and afterwards she was in a lots of pain.  Oddly a few weeks ago she was in [place] and walked over 10 miles a day and had absolutely no pain during or afterwards.  She is heading to [place] July 1 and will be backpacking for a week and then exploring the country for another week afterwards and does not want to be in debilitating pain during her trip.  In addition to the pain in her hip she has right foot numbness that comes on when she has right hip pain.  Numbness does not extend through the leg and is isolated in the entire foot.  She tends to hold her leg up a lot in a sitting position as this helps alleviate her symptoms.  PERTINENT HISTORY: Mild scoliosis, bipolar PAIN:  Are you having pain? Yes: NPRS scale: 4, at worse and more broad spread/10 Pain location: right hip into leg Pain description: pulling, numbness, tight Aggravating factors: pure barre, walking, sleeping on right side, hiking Relieving factors: Unsure  PRECAUTIONS: None  WEIGHT BEARING RESTRICTIONS: No  FALLS:  Has patient fallen in last 6 months? 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  In sitting she leans to the left and holds up her right leg in passive hip flexion  PALPATION: Tenderness to palpation and increased resting tone noted in right glutes and left lumbar paraspinals, no  pain with spring testing    LE Measurements Lower Extremity Right EVAL Left EVAL   A/PROM MMT A/PROM MMT  Hip Flexion WFL 3* WFL 4  Hip Extension  3+*  4-  Hip Abduction 15* (AROM) 3* 30 4-  Hip Adduction  3*  4-  Hip Internal rotation [facility]  [facility]   Hip External rotation WF  [facility]   Knee Flexion  3*  4  Knee Extension  3+  4  Ankle Dorsiflexion  4+  4+  Ankle Plantarflexion      Ankle Inversion      Ankle Eversion       (Blank rows = not tested) * pain   LOWER EXTREMITY SPECIAL TESTS:  Slump and SLR neg B Step down test - valgus, hip and trunk collapse B with R>L (right does not hip hinge at all) Walking- uses groin muscles to pull legs forward with hips in ER - wide BOS - excessive lumbar extension   TODAY'S TREATMENT:                                                                                                                              DATE:   08/03/2022  Therapeutic Exercise:  Aerobic: Supine: bridge with red band 2x12, hip add iso - one leg at a time 2 minutes total with 5" holds B S/l: clam and reverse clam 2x12 E R  Prone:  Seated:  Standing: hip hinge over table - difficult for anterior pelvic tilt despite tactile cues Neuromuscular Re-education: long exhale breathing - rational why and how 12 minutes total, pelvic tilts in supine and quad - focus on sit bone movement (practiced sit bone awareness in seated position 13 minutes Manual Therapy: STM to lett glutes - tolerated well - performed throughout session 13 minutes Therapeutic Activity: Self Care: Trigger Point Dry Needling:  Modalities:    PATIENT EDUCATION:  Education details: on current presentation, on HEP, on clinical outcomes score and POC Person educated: Patient Education method: Explanation, Demonstration, and Handouts Education comprehension: verbalized understanding   HOME EXERCISE PROGRAM: [id number]  ASSESSMENT:  CLINICAL IMPRESSION: 08/03/2022 Session focused on  review of exercises  and progressing as able. Patient with difficulty completely hip hinge secondary to excessive lumbar extension and limited anterior pelvic tilt. This did not improve positionally. Educated patient on focusing on ischial tuberosities for more controlled and isolated movement. This was also difficult. Cued patient for core activation with long exhale and lower rib decompression and this helped control excessive lumbar motion. Educated patient on focus on sit bones, pelvic motion and breathing to help with core/pelvic floor/diaphragm activation. No pain noted end of session. Will continue with current POC as tolerated.    Eval: Patient presents to skilled physical therapy with complaints of chronic right hip pain that has worsened over the last year.  Patient was performing barre classes but this was painful and is not currently performing any strength training exercises at this time.  Despite this patient is highly active walking her dog and hiking up to 10 miles at a time.  These activities are painful especially afterwards which is concerning as patient has backpacking trip to [place] in July of this year.  Patient demonstrates significant weakness and instability throughout lower extremities and trunk and would greatly benefit from skilled physical therapy at this time to improve overall function and reduce risk of further injury.  OBJECTIVE IMPAIRMENTS: decreased activity tolerance, decreased balance, difficulty walking, decreased ROM, decreased strength, improper body mechanics, postural dysfunction, and pain.   ACTIVITY LIMITATIONS: standing, squatting, sleeping, stairs, and locomotion level  PARTICIPATION LIMITATIONS: cleaning, shopping, and community activity  PERSONAL FACTORS: Fitness and Time since onset of injury/illness/exacerbation are also affecting patient's functional outcome.   REHAB POTENTIAL: Good  CLINICAL DECISION MAKING: Stable/uncomplicated  EVALUATION COMPLEXITY:  Low   GOALS: Goals reviewed with patient? yes  SHORT TERM GOALS: Target date: 09/12/2022  Patient will be independent in self management strategies to improve quality of life and functional outcomes. Baseline: New Program Goal status: INITIAL  2.  Patient will report at least 50% improvement in overall symptoms and/or function to demonstrate improved functional mobility Baseline: 0% better Goal status: INITIAL  3.  Patient will be able to perform side-lying hip abduction at wall without pain to demonstrate improved hip function. Baseline: Painful unable Goal status: INITIAL  4.  Patient will be able to demonstrate hip hinge with good form and no pain. Baseline: Unable. Goal status: INITIAL    LONG TERM GOALS: Target date: 10/24/2022   Patient will report at least 75% improvement in overall symptoms and/or function to demonstrate improved functional mobility Baseline: 0% better Goal status: INITIAL  2.  Patient will improve score on FOTO outcomes measure to projected score to demonstrate overall improved function and QOL. Baseline: see above Goal status: INITIAL  3.  Patient was able to perform step down test with good control of hip and knee to reduce strain on lower extremity improve ability to hike up in the mountains. Baseline: Poor control bilaterally right greater than left Goal status: INITIAL  4.  Patient will demonstrate at least 4/5 MMT strength in bilateral lower extremities Baseline: See above Goal status: INITIAL    PLAN:  PT FREQUENCY: 1-2x/week for total of 16 visits over 12 week certifcation  PT DURATION: 12 weeks  PLANNED INTERVENTIONS: Therapeutic exercises, Therapeutic activity, Neuromuscular re-education, Balance training, Gait training, Patient/Family education, Self Care, Joint mobilization, Joint manipulation, Stair training, Vestibular training, Canalith repositioning, Orthotic/Fit training, Prosthetic training, DME instructions, Aquatic  Therapy, Dry Needling, Electrical stimulation, Spinal manipulation, Spinal mobilization, Cryotherapy, Moist heat, Taping, Traction, Ultrasound, Ionotophoresis 4mg /ml Dexamethasone, Manual  therapy, and Re-evaluation.   PLAN FOR NEXT SESSION: f/u long exhale/pelvic tilts/sit bone focus/hip hinge hip iso, stability, core work (breathing- lower rib decompression) hip hinge, pain management strategies, sit bones and posture   4:20 PM, 08/03/22 Tereasa Coop, DPT Physical Therapy with [place]

## 2022-08-10 ENCOUNTER — Encounter: Payer: Self-pay | Admitting: Physical Therapy

## 2022-08-10 ENCOUNTER — Ambulatory Visit (INDEPENDENT_AMBULATORY_CARE_PROVIDER_SITE_OTHER): Payer: Medicare Other | Admitting: Physical Therapy

## 2022-08-10 DIAGNOSIS — M25551 Pain in right hip: Secondary | ICD-10-CM

## 2022-08-10 DIAGNOSIS — M6281 Muscle weakness (generalized): Secondary | ICD-10-CM | POA: Diagnosis not present

## 2022-08-10 DIAGNOSIS — R262 Difficulty in walking, not elsewhere classified: Secondary | ICD-10-CM | POA: Diagnosis not present

## 2022-08-10 NOTE — Therapy (Signed)
OUTPATIENT PHYSICAL THERAPY LOWER EXTREMITY TREATMENT   Patient Name: Amber Schultz MRN: [id number] DOB:10-Apr-1985, 37 y.o., female Today's Date: 08/10/2022  END OF SESSION:  PT End of Session - 08/10/22 1349     Visit Number 3    Number of Visits 16    Date for PT Re-Evaluation 10/24/22    Authorization Type [employer]BS medicare    PT Start Time 1350    PT Stop Time 1440    PT Time Calculation (min) 50 min    Activity Tolerance Patient limited by pain    Behavior During Therapy [facility] for tasks assessed/performed              Past Medical History:  Diagnosis Date   Allergy    Anxiety    Bipolar 1 disorder (HCC)    Depression    Goiter    History reviewed. No pertinent surgical history. Patient Active Problem List   Diagnosis Date Noted   Neck pain 08/02/2017   Musculoskeletal pain 08/02/2017   Muscle spasm 08/02/2017   Anxiety 05/18/2015   Bipolar I disorder (HCC) 10/10/2012    PCP:  Georgina Quint, MD   REFERRING PROVIDER:  Rodolph Bong, MD- [facility]  REFERRING DIAG: [phone] (ICD-10-CM) - Pain in joint of right hip   THERAPY DIAG:  Pain in right hip  Muscle weakness (generalized)  Difficulty in walking, not elsewhere classified  Rationale for Evaluation and Treatment: Rehabilitation  ONSET DATE: a year ago  SUBJECTIVE:   SUBJECTIVE STATEMENT: 08/10/2022 No pain been doing exercises and feel like they are helping.   Eval: Patient reports that her right hip has been bothering her for over a year.  It is started to hurt when she is performing pure barre classes and she is unable to do most the moves but they did in class.  Hip continued to bother her even with modification so she stopped going to barre classes.  She tried going to a chiropractor which helped with her mid back pain but did not help with her hip pain.  In fact manipulations of hip actually increased hip pain afterwards.  Reports she likes to be very active and likes to walk her  dog and hike.  However her dog is younger and does tend to pull and she does not know if this is contributing to her current issue.  Her last hike was in [place] in snow and ice which she wore snow shoes and cramps and afterwards she was in a lots of pain.  Oddly a few weeks ago she was in [place] and walked over 10 miles a day and had absolutely no pain during or afterwards.  She is heading to [place] July 1 and will be backpacking for a week and then exploring the country for another week afterwards and does not want to be in debilitating pain during her trip.  In addition to the pain in her hip she has right foot numbness that comes on when she has right hip pain.  Numbness does not extend through the leg and is isolated in the entire foot.  She tends to hold her leg up a lot in a sitting position as this helps alleviate her symptoms.  PERTINENT HISTORY: Mild scoliosis, bipolar PAIN:  Are you having pain? no   PRECAUTIONS: None  WEIGHT BEARING RESTRICTIONS: No  FALLS:  Has patient fallen in last 6 months? 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  In sitting she leans to the left and holds up her right leg in passive hip flexion  PALPATION: Tenderness to palpation and increased resting tone noted in right glutes and left lumbar paraspinals, no pain with spring testing    LE Measurements Lower Extremity Right EVAL Left EVAL   A/PROM MMT A/PROM MMT  Hip Flexion WFL 3* WFL 4  Hip Extension  3+*  4-  Hip Abduction 15* (AROM) 3* 30 4-  Hip  Adduction  3*  4-  Hip Internal rotation [facility]  [facility]   Hip External rotation WF  [facility]   Knee Flexion  3*  4  Knee Extension  3+  4  Ankle Dorsiflexion  4+  4+  Ankle Plantarflexion      Ankle Inversion      Ankle Eversion       (Blank rows = not tested) * pain   LOWER EXTREMITY SPECIAL TESTS:  Slump and SLR neg B Step down test - valgus, hip and trunk collapse B with R>L (right does not hip hinge at all) Walking- uses groin muscles to pull legs forward with hips in ER - wide BOS - excessive lumbar extension   TODAY'S TREATMENT:                                                                                                                              DATE:   08/10/2022  Therapeutic Exercise:  Aerobic: Supine: Prone: Forearm and arms extended plank cues for form 5 minutes  Seated:  Standing: Stepdown hip hinge very difficult practice bilaterally with bilateral upper extremity support 5 minutes pain in knees stopped, field goal position at wall for latissimus stretch and scapular protraction also practiced in quadruped 10 minutes total Neuromuscular Re-education:  Pelvic tilts in seated, seated on ball, and standing as well as supine tactile and verbal cues 15 minutes, hip hinge standing with forward reach over table focus on pressing knees out tolerated well tactile and verbal cues visual cues 15 minutes Manual Therapy:  Therapeutic Activity: Self Care: Trigger Point Dry Needling:  Modalities:    PATIENT EDUCATION:  Education details: on  HEP Person educated: Patient Education method: Programmer, multimedia, Facilities manager, and Handouts Education comprehension: verbalized understanding   HOME EXERCISE PROGRAM: [id number]  ASSESSMENT:  CLINICAL IMPRESSION: 08/10/2022 Session continue to focus on pelvic tilts, body awareness, and hip hinge motion.  This is still very difficult for patient as she primarily moves in knees and lumbar spine with itching at lower  thoracic spine.  Educated  patient and rationale for all exercises and interventions.  Tolerated hip hinge over table best and provided this for homework.  No pain in hip noted end of session we will continue with current plan of care as tolerated.  Eval: Patient presents to skilled physical therapy with complaints of chronic right hip pain that has worsened over the last year.  Patient was performing barre classes but this was painful and is not currently performing any strength training exercises at this time.  Despite this patient is highly active walking her dog and hiking up to 10 miles at a time.  These activities are painful especially afterwards which is concerning as patient has backpacking trip to [place] in July of this year.  Patient demonstrates significant weakness and instability throughout lower extremities and trunk and would greatly benefit from skilled physical therapy at this time to improve overall function and reduce risk of further injury.  OBJECTIVE IMPAIRMENTS: decreased activity tolerance, decreased balance, difficulty walking, decreased ROM, decreased strength, improper body mechanics, postural dysfunction, and pain.   ACTIVITY LIMITATIONS: standing, squatting, sleeping, stairs, and locomotion level  PARTICIPATION LIMITATIONS: cleaning, shopping, and community activity  PERSONAL FACTORS: Fitness and Time since onset of injury/illness/exacerbation are also affecting patient's functional outcome.   REHAB POTENTIAL: Good  CLINICAL DECISION MAKING: Stable/uncomplicated  EVALUATION COMPLEXITY: Low   GOALS: Goals reviewed with patient? yes  SHORT TERM GOALS: Target date: 09/12/2022  Patient will be independent in self management strategies to improve quality of life and functional outcomes. Baseline: New Program Goal status: INITIAL  2.  Patient will report at least 50% improvement in overall symptoms and/or function to demonstrate improved functional mobility Baseline: 0% better Goal status:  INITIAL  3.  Patient will be able to perform side-lying hip abduction at wall without pain to demonstrate improved hip function. Baseline: Painful unable Goal status: INITIAL  4.  Patient will be able to demonstrate hip hinge with good form and no pain. Baseline: Unable. Goal status: INITIAL    LONG TERM GOALS: Target date: 10/24/2022   Patient will report at least 75% improvement in overall symptoms and/or function to demonstrate improved functional mobility Baseline: 0% better Goal status: INITIAL  2.  Patient will improve score on FOTO outcomes measure to projected score to demonstrate overall improved function and QOL. Baseline: see above Goal status: INITIAL  3.  Patient was able to perform step down test with good control of hip and knee to reduce strain on lower extremity improve ability to hike up in the mountains. Baseline: Poor control bilaterally right greater than left Goal status: INITIAL  4.  Patient will demonstrate at least 4/5 MMT strength in bilateral lower extremities Baseline: See above Goal status: INITIAL    PLAN:  PT FREQUENCY: 1-2x/week for total of 16 visits over 12 week certifcation  PT DURATION: 12 weeks  PLANNED INTERVENTIONS: Therapeutic exercises, Therapeutic activity, Neuromuscular re-education, Balance training, Gait training, Patient/Family education, Self Care, Joint mobilization, Joint manipulation, Stair training, Vestibular training, Canalith repositioning, Orthotic/Fit training, Prosthetic training, DME instructions, Aquatic Therapy, Dry Needling, Electrical stimulation, Spinal manipulation, Spinal mobilization, Cryotherapy, Moist heat, Taping, Traction, Ultrasound, Ionotophoresis 4mg /ml Dexamethasone, Manual therapy, and Re-evaluation.   PLAN FOR NEXT SESSION: f/u long exhale/pelvic tilts/sit bone focus/hip hinge hip iso, stability, core work (breathing- lower rib decompression) hip hinge, pain management strategies, sit bones and  posture   2:49 PM, 08/10/22 Tereasa Coop, DPT Physical Therapy with [facility]

## 2022-08-14 ENCOUNTER — Ambulatory Visit (INDEPENDENT_AMBULATORY_CARE_PROVIDER_SITE_OTHER): Payer: Medicare Other | Admitting: Physical Therapy

## 2022-08-14 ENCOUNTER — Encounter: Payer: Self-pay | Admitting: Physical Therapy

## 2022-08-14 DIAGNOSIS — R262 Difficulty in walking, not elsewhere classified: Secondary | ICD-10-CM | POA: Diagnosis not present

## 2022-08-14 DIAGNOSIS — M6281 Muscle weakness (generalized): Secondary | ICD-10-CM | POA: Diagnosis not present

## 2022-08-14 DIAGNOSIS — M25551 Pain in right hip: Secondary | ICD-10-CM | POA: Diagnosis not present

## 2022-08-14 NOTE — Therapy (Signed)
OUTPATIENT PHYSICAL THERAPY LOWER EXTREMITY TREATMENT   Patient Name: Amber Schultz MRN: [id number] DOB:03-18-1985, 37 y.o., female Today's Date: 08/14/2022  END OF SESSION:  PT End of Session - 08/14/22 1305     Visit Number 4    Number of Visits 16    Date for PT Re-Evaluation 10/24/22    Authorization Type BCBS medicare    PT Start Time 1305    PT Stop Time 1343    PT Time Calculation (min) 38 min    Activity Tolerance Patient limited by pain    Behavior During Therapy [facility] for tasks assessed/performed              Past Medical History:  Diagnosis Date   Allergy    Anxiety    Bipolar 1 disorder (HCC)    Depression    Goiter    History reviewed. No pertinent surgical history. Patient Active Problem List   Diagnosis Date Noted   Neck pain 08/02/2017   Musculoskeletal pain 08/02/2017   Muscle spasm 08/02/2017   Anxiety 05/18/2015   Bipolar I disorder (HCC) 10/10/2012    PCP:  Georgina Quint, MD   REFERRING PROVIDER:  Rodolph Bong, MD- [facility]  REFERRING DIAG: [phone] (ICD-10-CM) - Pain in joint of right hip   THERAPY DIAG:  Pain in right hip  Muscle weakness (generalized)  Difficulty in walking, not elsewhere classified  Rationale for Evaluation and Treatment: Rehabilitation  ONSET DATE: a year ago  SUBJECTIVE:   SUBJECTIVE STATEMENT: 08/14/2022 States her hip was bothering her. States she has been sitting more and not walking as much. States she has been getting tightness and numbness in her leg. States she had that this morning. States she has been sitting  a lot over the weekend.  Eval: Patient reports that her right hip has been bothering her for over a year.  It is started to hurt when she is performing pure barre classes and she is unable to do most the moves but they did in class.  Hip continued to bother her even with modification so she stopped going to barre classes.  She tried going to a chiropractor which helped with her  mid back pain but did not help with her hip pain.  In fact manipulations of hip actually increased hip pain afterwards.  Reports she likes to be very active and likes to walk her dog and hike.  However her dog is younger and does tend to pull and she does not know if this is contributing to her current issue.  Her last hike was in [place] in snow and ice which she wore snow shoes and cramps and afterwards she was in a lots of pain.  Oddly a few weeks ago she was in [place] and walked over 10 miles a day and had absolutely no pain during or afterwards.  She is heading to [place] July 1 and will be backpacking for a week and then exploring the country for another week afterwards and does not want to be in debilitating pain during her trip.  In addition to the pain in her hip she has right foot numbness that comes on when she has right hip pain.  Numbness does not extend through the leg and is isolated in the entire foot.  She tends to hold her leg up a lot in a sitting position as this helps alleviate her symptoms.  PERTINENT HISTORY: Mild scoliosis, bipolar PAIN:  Are you having pain? no  PRECAUTIONS: None  WEIGHT BEARING RESTRICTIONS: No  FALLS:  Has patient fallen in last 6 months? 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  In sitting she leans to the left and holds up her right leg in passive hip flexion  PALPATION: Tenderness to palpation and increased resting tone noted in right glutes and left lumbar paraspinals, no pain with spring  testing    LE Measurements Lower Extremity Right EVAL Left EVAL   A/PROM MMT A/PROM MMT  Hip Flexion WFL 3* WFL 4  Hip Extension  3+*  4-  Hip Abduction 15* (AROM) 3* 30 4-  Hip Adduction  3*  4-  Hip Internal rotation [facility]   Hip External rotation WF  [facility]   Knee Flexion  3*  4  Knee Extension  3+  4  Ankle Dorsiflexion  4+  4+  Ankle Plantarflexion      Ankle Inversion      Ankle Eversion       (Blank rows = not tested) * pain   LOWER EXTREMITY SPECIAL TESTS:  Slump and SLR neg B Step down test - valgus, hip and trunk collapse B with R>L (right does not hip hinge at all) Walking- uses groin muscles to pull legs forward with hips in ER - wide BOS - excessive lumbar extension   TODAY'S TREATMENT:                                                                                                                              DATE:   08/14/2022  Therapeutic Exercise:  Aerobic: Supine: Prone:  Seated: piriformis ROM not stretch 1 minute, hip IR/ER 2 minutes, hip abd/add iso 2 minutes   Standing:   Neuromuscular Re-education: Posture with lumbar and upper back support as well as elevated seat practice and discussion on how to achieve this on plane/at home/at parents house 15 minutes   Manual Therapy:  Therapeutic Activity: Self Care: Trigger Point Dry Needling:  Modalities:    PATIENT EDUCATION:  Education details: on  HEP and importance of sitting and standing posture, on importance of active positioning versus passive position, on importance of not overstretching as nerves are sensitive to overstretch, and gentle muscle activation, and use of TENS/topicals like Voltaren or IcyHot to help with pain.  Also discussed safe use of pain patches if indicated. Person educated: Patient Education method: Explanation, Demonstration, and Handouts Education comprehension: verbalized understanding   HOME  EXERCISE PROGRAM: [id number]  ASSESSMENT:  CLINICAL  IMPRESSION: 08/14/2022 Session focused on education and review of importance of not overdoing things as patient has a tendency to overstretch muscles and nerves causing radicular symptoms down leg.  Discussed topicals as well as positioning and possible benefits of TENS to help with pain.  Practiced proper active posture and supportive positioning in clinic.  Also reviewed home exercise program with pain-free range of motion with less overall mobility.  Discussed staying 25% less than end ranges to reduce stress on nerves.  Tolerated this well and reported no radicular symptoms end of session but continued tightness in hip.  Will potentially trial dry needling next session and follow-up with current plan of care as tolerated.  Eval: Patient presents to skilled physical therapy with complaints of chronic right hip pain that has worsened over the last year.  Patient was performing barre classes but this was painful and is not currently performing any strength training exercises at this time.  Despite this patient is highly active walking her dog and hiking up to 10 miles at a time.  These activities are painful especially afterwards which is concerning as patient has backpacking trip to [place] in July of this year.  Patient demonstrates significant weakness and instability throughout lower extremities and trunk and would greatly benefit from skilled physical therapy at this time to improve overall function and reduce risk of further injury.  OBJECTIVE IMPAIRMENTS: decreased activity tolerance, decreased balance, difficulty walking, decreased ROM, decreased strength, improper body mechanics, postural dysfunction, and pain.   ACTIVITY LIMITATIONS: standing, squatting, sleeping, stairs, and locomotion level  PARTICIPATION LIMITATIONS: cleaning, shopping, and community activity  PERSONAL FACTORS: Fitness and Time since onset of injury/illness/exacerbation are also affecting patient's functional outcome.    REHAB POTENTIAL: Good  CLINICAL DECISION MAKING: Stable/uncomplicated  EVALUATION COMPLEXITY: Low   GOALS: Goals reviewed with patient? yes  SHORT TERM GOALS: Target date: 09/12/2022  Patient will be independent in self management strategies to improve quality of life and functional outcomes. Baseline: New Program Goal status: INITIAL  2.  Patient will report at least 50% improvement in overall symptoms and/or function to demonstrate improved functional mobility Baseline: 0% better Goal status: INITIAL  3.  Patient will be able to perform side-lying hip abduction at wall without pain to demonstrate improved hip function. Baseline: Painful unable Goal status: INITIAL  4.  Patient will be able to demonstrate hip hinge with good form and no pain. Baseline: Unable. Goal status: INITIAL    LONG TERM GOALS: Target date: 10/24/2022   Patient will report at least 75% improvement in overall symptoms and/or function to demonstrate improved functional mobility Baseline: 0% better Goal status: INITIAL  2.  Patient will improve score on FOTO outcomes measure to projected score to demonstrate overall improved function and QOL. Baseline: see above Goal status: INITIAL  3.  Patient was able to perform step down test with good control of hip and knee to reduce strain on lower extremity improve ability to hike up in the mountains. Baseline: Poor control bilaterally right greater than left Goal status: INITIAL  4.  Patient will demonstrate at least 4/5 MMT strength in bilateral lower extremities Baseline: See above Goal status: INITIAL    PLAN:  PT FREQUENCY: 1-2x/week for total of 16 visits over 12 week certifcation  PT DURATION: 12 weeks  PLANNED INTERVENTIONS: Therapeutic exercises, Therapeutic activity, Neuromuscular re-education, Balance training, Gait training, Patient/Family education, Self Care, Joint mobilization, Joint manipulation, Stair training, Vestibular training,  Canalith repositioning, Orthotic/Fit training, Prosthetic training, DME instructions, Aquatic Therapy, Dry Needling, Electrical stimulation, Spinal manipulation, Spinal mobilization, Cryotherapy, Moist heat, Taping, Traction, Ultrasound, Ionotophoresis 4mg /ml Dexamethasone, Manual therapy, and Re-evaluation.   PLAN FOR NEXT SESSION: f/u long exhale/pelvic tilts/sit bone focus/hip hinge hip iso, stability, core work (breathing- lower rib decompression) hip hinge, pain management strategies, sit bones and posture  dry needling?   1:50 PM, 08/14/22 Tereasa Coop, DPT Physical Therapy with [place]

## 2022-08-16 ENCOUNTER — Encounter: Payer: Self-pay | Admitting: Physical Therapy

## 2022-08-16 ENCOUNTER — Ambulatory Visit (INDEPENDENT_AMBULATORY_CARE_PROVIDER_SITE_OTHER): Payer: Medicare Other | Admitting: Physical Therapy

## 2022-08-16 DIAGNOSIS — R262 Difficulty in walking, not elsewhere classified: Secondary | ICD-10-CM | POA: Diagnosis not present

## 2022-08-16 DIAGNOSIS — M25551 Pain in right hip: Secondary | ICD-10-CM | POA: Diagnosis not present

## 2022-08-16 DIAGNOSIS — M6281 Muscle weakness (generalized): Secondary | ICD-10-CM

## 2022-08-16 NOTE — Therapy (Signed)
OUTPATIENT PHYSICAL THERAPY LOWER EXTREMITY TREATMENT   Patient Name: Amber Schultz MRN: [id number] DOB:[birth year], 37 y.o., female Today's Date: 08/16/2022  END OF SESSION:  PT End of Session - 08/16/22 1305     Visit Number 5    Number of Visits 16    Date for PT Re-Evaluation 10/24/22    Authorization Type BCBS medicare    PT Start Time 1305    PT Stop Time 1346    PT Time Calculation (min) 41 min    Activity Tolerance Patient limited by pain    Behavior During Therapy [facility] for tasks assessed/performed              Past Medical History:  Diagnosis Date   Allergy    Anxiety    Bipolar 1 disorder (HCC)    Depression    Goiter    History reviewed. No pertinent surgical history. Patient Active Problem List   Diagnosis Date Noted   Neck pain 08/02/2017   Musculoskeletal pain 08/02/2017   Muscle spasm 08/02/2017   Anxiety 05/18/2015   Bipolar I disorder (HCC) 10/10/2012    PCP:  Georgina Quint, MD   REFERRING PROVIDER:  Rodolph Bong, MD- [facility]  REFERRING DIAG: [phone] (ICD-10-CM) - Pain in joint of right hip   THERAPY DIAG:  Pain in right hip  Muscle weakness (generalized)  Difficulty in walking, not elsewhere classified  Rationale for Evaluation and Treatment: Rehabilitation  ONSET DATE: a year ago  SUBJECTIVE:   SUBJECTIVE STATEMENT: 08/16/2022 States that she was hiking no problems going up but knee and hip pain going down  Eval: Patient reports that her right hip has been bothering her for over a year.  It is started to hurt when she is performing pure barre classes and she is unable to do most the moves but they did in class.  Hip continued to bother her even with modification so she stopped going to barre classes.  She tried going to a chiropractor which helped with her mid back pain but did not help with her hip pain.  In fact manipulations of hip actually increased hip pain afterwards.  Reports she likes to be very active  and likes to walk her dog and hike.  However her dog is younger and does tend to pull and she does not know if this is contributing to her current issue.  Her last hike was in [place] in snow and ice which she wore snow shoes and cramps and afterwards she was in a lots of pain.  Oddly a few weeks ago she was in [place] and walked over 10 miles a day and had absolutely no pain during or afterwards.  She is heading to [place] July 1 and will be backpacking for a week and then exploring the country for another week afterwards and does not want to be in debilitating pain during her trip.  In addition to the pain in her hip she has right foot numbness that comes on when she has right hip pain.  Numbness does not extend through the leg and is isolated in the entire foot.  She tends to hold her leg up a lot in a sitting position as this helps alleviate her symptoms.  PERTINENT HISTORY: Mild scoliosis, bipolar PAIN:  Are you having pain? Irritation to right hip 1/10   PRECAUTIONS: None  WEIGHT BEARING RESTRICTIONS: No  FALLS:  Has patient fallen in last 6 months? 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  In sitting she leans to the left and holds up her right leg in passive hip flexion  PALPATION: Tenderness to palpation and increased resting tone noted in right glutes and left lumbar paraspinals, no pain with spring testing    LE Measurements Lower Extremity Right EVAL Left EVAL   A/PROM MMT A/PROM MMT  Hip Flexion WFL 3* WFL 4  Hip Extension  3+*   4-  Hip Abduction 15* (AROM) 3* 30 4-  Hip Adduction  3*  4-  Hip Internal rotation [facility]  [facility]   Hip External rotation WF  WFL   Knee Flexion  3*  4  Knee Extension  3+  4  Ankle Dorsiflexion  4+  4+  Ankle Plantarflexion      Ankle Inversion      Ankle Eversion       (Blank rows = not tested) * pain   LOWER EXTREMITY SPECIAL TESTS:  Slump and SLR neg B Step down test - valgus, hip and trunk collapse B with R>L (right does not hip hinge at all) Walking- uses groin muscles to pull legs forward with hips in ER - wide BOS - excessive lumbar extension   TODAY'S TREATMENT:                                                                                                                              DATE:   08/16/2022  Therapeutic Exercise:  Aerobic: Supine: Prone:  Seated:    Standing:   Neuromuscular Re-education: hip ER /IR focus on foot on ground not moving toes 8 minutes green band, hip hinge standing at step 15 minutes B- UE support   Manual Therapy: STM to right glutes - tolerated well  Therapeutic Activity: Self Care: Trigger Point Dry Needling:  Trigger Point Dry-Needling  Treatment instructions: Expect mild to moderate muscle soreness. S/S of pneumothorax if dry needled over a lung field, and to seek immediate medical attention should they occur. Patient verbalized understanding of these instructions and education.  Patient Consent Given: Yes Education handout provided: Previously provided Muscles treated: right glutes/TFL in s/l Electrical stimulation performed: No Parameters: N/A Treatment response/outcome: musle twict reduced tension in muscles  Modalities:    PATIENT EDUCATION:  Education details: on DN risks/benefits and use of KT tape, different braces and their benefits.  Person educated: Patient Education method: Explanation, Demonstration, and Handouts Education comprehension: verbalized understanding   HOME EXERCISE  PROGRAM: [id number]  ASSESSMENT:  CLINICAL IMPRESSION: 08/16/2022 Reviewed stepdown mechanics to make going down mountain while hiking.  Continued limitations with hip hinge but improved since last time.  Perform dry needling on right hip, tolerated well but no significant change except achiness noted afterwards.  Will follow-up with post needling benefits/soreness next session.  Will potentially trial proprioceptive tape to help with improved femoral external rotation with descent to downstairs/mountain.  Overall patient is doing well but continues to lack control of isolated joints and proper muscle activation.  Will continue with current plan of care as tolerated.  Eval: Patient presents to skilled physical therapy with complaints of chronic right hip pain that has worsened over the last year.  Patient was performing barre classes but this was painful and is not currently performing any strength training exercises at this time.  Despite this patient is highly active walking her dog and hiking up to 10 miles at a time.  These activities are painful especially afterwards which is concerning as patient has backpacking trip to [place] in July of this year.  Patient demonstrates significant weakness and instability throughout lower extremities and trunk and would greatly benefit from skilled physical therapy at this time to improve overall function and reduce risk of further injury.  OBJECTIVE IMPAIRMENTS: decreased activity tolerance, decreased balance, difficulty walking, decreased ROM, decreased strength, improper body mechanics, postural dysfunction, and pain.   ACTIVITY LIMITATIONS: standing, squatting, sleeping, stairs, and locomotion level  PARTICIPATION LIMITATIONS: cleaning, shopping, and community activity  PERSONAL FACTORS: Fitness and Time since onset of injury/illness/exacerbation are also affecting patient's functional outcome.   REHAB POTENTIAL: Good  CLINICAL DECISION MAKING:  Stable/uncomplicated  EVALUATION COMPLEXITY: Low   GOALS: Goals reviewed with patient? yes  SHORT TERM GOALS: Target date: 09/12/2022  Patient will be independent in self management strategies to improve quality of life and functional outcomes. Baseline: New Program Goal status: INITIAL  2.  Patient will report at least 50% improvement in overall symptoms and/or function to demonstrate improved functional mobility Baseline: 0% better Goal status: INITIAL  3.  Patient will be able to perform side-lying hip abduction at wall without pain to demonstrate improved hip function. Baseline: Painful unable Goal status: INITIAL  4.  Patient will be able to demonstrate hip hinge with good form and no pain. Baseline: Unable. Goal status: INITIAL    LONG TERM GOALS: Target date: 10/24/2022   Patient will report at least 75% improvement in overall symptoms and/or function to demonstrate improved functional mobility Baseline: 0% better Goal status: INITIAL  2.  Patient will improve score on FOTO outcomes measure to projected score to demonstrate overall improved function and QOL. Baseline: see above Goal status: INITIAL  3.  Patient was able to perform step down test with good control of hip and knee to reduce strain on lower extremity improve ability to hike up in the mountains. Baseline: Poor control bilaterally right greater than left Goal status: INITIAL  4.  Patient will demonstrate at least 4/5 MMT strength in bilateral lower extremities Baseline: See above Goal status: INITIAL    PLAN:  PT FREQUENCY: 1-2x/week for total of 16 visits over 12 week certifcation  PT DURATION: 12 weeks  PLANNED INTERVENTIONS: Therapeutic exercises, Therapeutic activity, Neuromuscular re-education, Balance training, Gait training, Patient/Family education, Self Care, Joint mobilization, Joint manipulation, Stair training, Vestibular training, Canalith repositioning, Orthotic/Fit training,  Prosthetic training, DME instructions, Aquatic Therapy, Dry Needling, Electrical stimulation, Spinal manipulation, Spinal mobilization, Cryotherapy, Moist heat, Taping, Traction, Ultrasound, Ionotophoresis 4mg /ml Dexamethasone, Manual  therapy, and Re-evaluation.   PLAN FOR NEXT SESSION: f/u long exhale/pelvic tilts/sit bone focus/hip hinge hip iso, stability, core work (breathing- lower rib decompression) hip hinge, pain management strategies, sit bones and posture  dry needling f/u - KT tape    2:33 PM, 08/16/22 Tereasa Coop, DPT Physical Therapy with Dolores Lory

## 2022-08-21 ENCOUNTER — Encounter: Payer: Self-pay | Admitting: Physical Therapy

## 2022-08-21 ENCOUNTER — Ambulatory Visit (INDEPENDENT_AMBULATORY_CARE_PROVIDER_SITE_OTHER): Payer: Medicare Other | Admitting: Physical Therapy

## 2022-08-21 DIAGNOSIS — R262 Difficulty in walking, not elsewhere classified: Secondary | ICD-10-CM | POA: Diagnosis not present

## 2022-08-21 DIAGNOSIS — M6281 Muscle weakness (generalized): Secondary | ICD-10-CM

## 2022-08-21 DIAGNOSIS — M25551 Pain in right hip: Secondary | ICD-10-CM

## 2022-08-21 NOTE — Therapy (Signed)
OUTPATIENT PHYSICAL THERAPY LOWER EXTREMITY TREATMENT   Patient Name: Amber Schultz MRN: [id number] DOB:Jul 20, 1985, 37 y.o., female Today's Date: 08/21/2022  END OF SESSION:  PT End of Session - 08/21/22 1303     Visit Number 6    Number of Visits 16    Date for PT Re-Evaluation 10/24/22    Authorization Type [employer]BS medicare    PT Start Time 1303    PT Stop Time 1402    PT Time Calculation (min) 59 min    Activity Tolerance Patient limited by pain    Behavior During Therapy [facility] for tasks assessed/performed              Past Medical History:  Diagnosis Date   Allergy    Anxiety    Bipolar 1 disorder (HCC)    Depression    Goiter    History reviewed. No pertinent surgical history. Patient Active Problem List   Diagnosis Date Noted   Neck pain 08/02/2017   Musculoskeletal pain 08/02/2017   Muscle spasm 08/02/2017   Anxiety 05/18/2015   Bipolar I disorder (HCC) 10/10/2012    PCP:  Georgina Quint, MD   REFERRING PROVIDER:  Rodolph Bong, MD- [facility]  REFERRING DIAG: [phone] (ICD-10-CM) - Pain in joint of right hip   THERAPY DIAG:  Pain in right hip  Muscle weakness (generalized)  Difficulty in walking, not elsewhere classified  Rationale for Evaluation and Treatment: Rehabilitation  ONSET DATE: a year ago  SUBJECTIVE:   SUBJECTIVE STATEMENT: 08/21/2022 States that she has had extra tightness in her hip but she has sat for  3 hours in a class and it has been bothering her. States she is still fine going up hills but not down.  Eval: Patient reports that her right hip has been bothering her for over a year.  It is started to hurt when she is performing pure barre classes and she is unable to do most the moves but they did in class.  Hip continued to bother her even with modification so she stopped going to barre classes.  She tried going to a chiropractor which helped with her mid back pain but did not help with her hip pain.  In fact  manipulations of hip actually increased hip pain afterwards.  Reports she likes to be very active and likes to walk her dog and hike.  However her dog is younger and does tend to pull and she does not know if this is contributing to her current issue.  Her last hike was in [place] in snow and ice which she wore snow shoes and cramps and afterwards she was in a lots of pain.  Oddly a few weeks ago she was in [place] and walked over 10 miles a day and had absolutely no pain during or afterwards.  She is heading to [place] July 1 and will be backpacking for a week and then exploring the country for another week afterwards and does not want to be in debilitating pain during her trip.  In addition to the pain in her hip she has right foot numbness that comes on when she has right hip pain.  Numbness does not extend through the leg and is isolated in the entire foot.  She tends to hold her leg up a lot in a sitting position as this helps alleviate her symptoms.  PERTINENT HISTORY: Mild scoliosis, bipolar PAIN:  Are you having pain? Irritation to right hip 1/10   PRECAUTIONS: None  WEIGHT BEARING RESTRICTIONS: No  FALLS:  Has patient fallen in last 6 months? 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  In sitting she leans to the left and holds up her right leg in passive hip flexion  PALPATION: Tenderness to palpation and increased resting tone noted in right glutes and left lumbar paraspinals, no pain with spring testing    LE Measurements Lower  Extremity Right EVAL Left EVAL   A/PROM MMT A/PROM MMT  Hip Flexion WFL 3* WFL 4  Hip Extension  3+*  4-  Hip Abduction 15* (AROM) 3* 30 4-  Hip Adduction  3*  4-  Hip Internal rotation [facility]  [facility]   Hip External rotation WF  WFL   Knee Flexion  3*  4  Knee Extension  3+  4  Ankle Dorsiflexion  4+  4+  Ankle Plantarflexion      Ankle Inversion      Ankle Eversion       (Blank rows = not tested) * pain   LOWER EXTREMITY SPECIAL TESTS:  Slump and SLR neg B Step down test - valgus, hip and trunk collapse B with R>L (right does not hip hinge at all) Walking- uses groin muscles to pull legs forward with hips in ER - wide BOS - excessive lumbar extension   TODAY'S TREATMENT:                                                                                                                              DATE:   08/21/2022  Therapeutic Exercise:  Aerobic: Supine: Prone:  Seated:    Standing: Self mobilization to knees right glutes with tennis ball 5 minutes Neuromuscular Re-education: KT tape to right leg to help reduce functional valgus and around knee for knee pain tolerated well improved knee mechanics with stairs afterwards educated patient and how/when to remove and apply tape safely.  Step mechanics with hip hinge without use of poles 5 minutes, posterior rib expansion with breathing in supine and prone tolerated prone best with pillow under core 15 minutes   Manual Therapy: STM to right glutes - tolerated well  Therapeutic Activity: Practiced going up and down stairs with trekking poles and adjusted trekking poles to proper height to mimic hiking and [place] focus on descendents and how and where to place poles 15 minutes Self Care:  Modalities:    PATIENT EDUCATION:  Education details: on safe use of KT tape  and how to apply, remove and keep on safely.  On safe use of trekking poles as well as proper height and adjustment in use with ascending and descending stairs Person  educated: Patient Education method: Explanation, Demonstration, and Handouts Education comprehension: verbalized understanding   HOME EXERCISE PROGRAM: [id number]  ASSESSMENT:  CLINICAL IMPRESSION: 08/21/2022 Session focused on education of safe use of KT tape and trekking poles.  Practiced going up and down stairs with trekking poles to mimic hiking which patient will be doing a lot in the next week while in [place].  Taped right leg which was more supportive and reduced pain in knee noted with descent going downstairs.  Patient with lower thoracic extension even in supine.  Educated patient in posterior rib expansion.  Very difficult for patient to perform supine but able to perform prone with pillow under belly.  No increase in pain noted end of session we will continue with current plan of care as tolerated.  Eval: Patient presents to skilled physical therapy with complaints of chronic right hip pain that has worsened over the last year.  Patient was performing barre classes but this was painful and is not currently performing any strength training exercises at this time.  Despite this patient is highly active walking her dog and hiking up to 10 miles at a time.  These activities are painful especially afterwards which is concerning as patient has backpacking trip to [place] in July of this year.  Patient demonstrates significant weakness and instability throughout lower extremities and trunk and would greatly benefit from skilled physical therapy at this time to improve overall function and reduce risk of further injury.  OBJECTIVE IMPAIRMENTS: decreased activity tolerance, decreased balance, difficulty walking, decreased ROM, decreased strength, improper body mechanics, postural dysfunction, and pain.   ACTIVITY LIMITATIONS: standing, squatting, sleeping, stairs, and locomotion level  PARTICIPATION LIMITATIONS: cleaning, shopping, and community activity  PERSONAL FACTORS: Fitness and Time  since onset of injury/illness/exacerbation are also affecting patient's functional outcome.   REHAB POTENTIAL: Good  CLINICAL DECISION MAKING: Stable/uncomplicated  EVALUATION COMPLEXITY: Low   GOALS: Goals reviewed with patient? yes  SHORT TERM GOALS: Target date: 09/12/2022  Patient will be independent in self management strategies to improve quality of life and functional outcomes. Baseline: New Program Goal status: INITIAL  2.  Patient will report at least 50% improvement in overall symptoms and/or function to demonstrate improved functional mobility Baseline: 0% better Goal status: INITIAL  3.  Patient will be able to perform side-lying hip abduction at wall without pain to demonstrate improved hip function. Baseline: Painful unable Goal status: INITIAL  4.  Patient will be able to demonstrate hip hinge with good form and no pain. Baseline: Unable. Goal status: INITIAL    LONG TERM GOALS: Target date: 10/24/2022   Patient will report at least 75% improvement in overall symptoms and/or function to demonstrate improved functional mobility Baseline: 0% better Goal status: INITIAL  2.  Patient will improve score on FOTO outcomes measure to projected score to demonstrate overall improved function and QOL. Baseline: see above Goal status: INITIAL  3.  Patient was able to perform step down test with good control of hip and knee to reduce strain on lower extremity improve ability to hike up in the mountains. Baseline: Poor control bilaterally right greater than left Goal status: INITIAL  4.  Patient will demonstrate at least 4/5 MMT strength in bilateral lower extremities Baseline: See above Goal status: INITIAL    PLAN:  PT FREQUENCY:  1-2x/week for total of 16 visits over 12 week certifcation  PT DURATION: 12 weeks  PLANNED INTERVENTIONS: Therapeutic exercises, Therapeutic activity, Neuromuscular re-education, Balance training, Gait training, Patient/Family  education, Self Care, Joint mobilization, Joint manipulation, Stair training, Vestibular training, Canalith repositioning, Orthotic/Fit training, Prosthetic training, DME instructions, Aquatic Therapy, Dry Needling, Electrical stimulation, Spinal manipulation, Spinal mobilization, Cryotherapy, Moist heat, Taping, Traction, Ultrasound, Ionotophoresis 4mg /ml Dexamethasone, Manual therapy, and Re-evaluation.   PLAN FOR NEXT SESSION: f/u long exhale/pelvic tilts/sit bone focus/hip hinge hip iso, stability, core work (breathing- lower rib decompression) hip hinge, pain management strategies, sit bones and posture  dry needling f/u - KT tape    2:11 PM, 08/21/22 Tereasa Coop, DPT Physical Therapy with Dolores Lory

## 2022-08-23 ENCOUNTER — Encounter: Payer: Self-pay | Admitting: Physical Therapy

## 2022-08-23 ENCOUNTER — Ambulatory Visit: Payer: Medicare Other | Admitting: Physical Therapy

## 2022-08-23 DIAGNOSIS — R262 Difficulty in walking, not elsewhere classified: Secondary | ICD-10-CM | POA: Diagnosis not present

## 2022-08-23 DIAGNOSIS — M6281 Muscle weakness (generalized): Secondary | ICD-10-CM | POA: Diagnosis not present

## 2022-08-23 DIAGNOSIS — H5213 Myopia, bilateral: Secondary | ICD-10-CM | POA: Diagnosis not present

## 2022-08-23 DIAGNOSIS — M25551 Pain in right hip: Secondary | ICD-10-CM | POA: Diagnosis not present

## 2022-08-23 NOTE — Therapy (Signed)
OUTPATIENT PHYSICAL THERAPY LOWER EXTREMITY TREATMENT   Patient Name: Amber Schultz MRN: [id number] DOB:11/24/1985, 37 y.o., female Today's Date: 08/23/2022  END OF SESSION:  PT End of Session - 08/23/22 1306     Visit Number 7    Number of Visits 16    Date for PT Re-Evaluation 10/24/22    Authorization Type [employer]BS medicare    PT Start Time 1305    PT Stop Time 1349    PT Time Calculation (min) 44 min    Activity Tolerance Patient limited by pain    Behavior During Therapy [facility] for tasks assessed/performed              Past Medical History:  Diagnosis Date   Allergy    Anxiety    Bipolar 1 disorder (HCC)    Depression    Goiter    History reviewed. No pertinent surgical history. Patient Active Problem List   Diagnosis Date Noted   Neck pain 08/02/2017   Musculoskeletal pain 08/02/2017   Muscle spasm 08/02/2017   Anxiety 05/18/2015   Bipolar I disorder (HCC) 10/10/2012    PCP:  Georgina Quint, MD   REFERRING PROVIDER:  Rodolph Bong, MD- [facility]  REFERRING DIAG: [phone] (ICD-10-CM) - Pain in joint of right hip   THERAPY DIAG:  Pain in right hip  Muscle weakness (generalized)  Difficulty in walking, not elsewhere classified  Rationale for Evaluation and Treatment: Rehabilitation  ONSET DATE: a year ago  SUBJECTIVE:   SUBJECTIVE STATEMENT: 08/23/2022 States she stopped noticing the tape, knee feels fine and hip is tight.   Eval: Patient reports that her right hip has been bothering her for over a year.  It is started to hurt when she is performing pure barre classes and she is unable to do most the moves but they did in class.  Hip continued to bother her even with modification so she stopped going to barre classes.  She tried going to a chiropractor which helped with her mid back pain but did not help with her hip pain.  In fact manipulations of hip actually increased hip pain afterwards.  Reports she likes to be very active and likes  to walk her dog and hike.  However her dog is younger and does tend to pull and she does not know if this is contributing to her current issue.  Her last hike was in [place] in snow and ice which she wore snow shoes and cramps and afterwards she was in a lots of pain.  Oddly a few weeks ago she was in [place] and walked over 10 miles a day and had absolutely no pain during or afterwards.  She is heading to [place] July 1 and will be backpacking for a week and then exploring the country for another week afterwards and does not want to be in debilitating pain during her trip.  In addition to the pain in her hip she has right foot numbness that comes on when she has right hip pain.  Numbness does not extend through the leg and is isolated in the entire foot.  She tends to hold her leg up a lot in a sitting position as this helps alleviate her symptoms.  PERTINENT HISTORY: Mild scoliosis, bipolar PAIN:  Are you having pain? Irritation to right hip 7/10   PRECAUTIONS: None  WEIGHT BEARING RESTRICTIONS: No  FALLS:  Has patient fallen in last 6 months? 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  In sitting she leans to the left and holds up her right leg in passive hip flexion  PALPATION: Tenderness to palpation and increased resting tone noted in right glutes and left lumbar paraspinals, no pain with spring testing    LE Measurements Lower Extremity Right EVAL Left EVAL   A/PROM MMT A/PROM MMT  Hip Flexion WFL 3* WFL 4  Hip Extension  3+*  4-  Hip  Abduction 15* (AROM) 3* 30 4-  Hip Adduction  3*  4-  Hip Internal rotation [facility]   Hip External rotation [facility]   Knee Flexion  3*  4  Knee Extension  3+  4  Ankle Dorsiflexion  4+  4+  Ankle Plantarflexion      Ankle Inversion      Ankle Eversion       (Blank rows = not tested) * pain   LOWER EXTREMITY SPECIAL TESTS:  Slump and SLR neg B Step down test - valgus, hip and trunk collapse B with R>L (right does not hip hinge at all) Walking- uses groin muscles to pull legs forward with hips in ER - wide BOS - excessive lumbar extension   TODAY'S TREATMENT:                                                                                                                              DATE:   08/23/2022  Therapeutic Exercise:  Aerobic: Supine: push pull SIJ iso 2x5 5" holds Prone: hip IR B 3 minutes, Hip IR/ER iso 6 min total B, Hip IR AROM 3 minutes   Seated:    Standing: Self mobilization to knees right glutes with tennis ball 5 minutes Neuromuscular Re-education:    Manual Therapy: STM to right glutes, right QL - tolerated well, cupping to lumbar paraspinals with PT manipulation  Therapeutic Activity:  Trigger Point Dry-Needling  Treatment instructions: Expect mild to moderate muscle soreness. S/S of pneumothorax if dry needled over a lung field, and to seek immediate medical attention should they occur. Patient verbalized understanding of these instructions and education.  Patient Consent Given: Yes Education handout provided: Previously provided Muscles treated: right glutes Electrical stimulation performed: No Parameters: N/A Treatment response/outcome: reduced muscle tension noted   Self Care:  Modalities:    PATIENT EDUCATION:  Education details: on use of rock tape, knit sneakers for camping shoes Person educated: Patient Education method: Explanation, Demonstration, and Handouts Education comprehension: verbalized understanding   HOME EXERCISE  PROGRAM: [id number]  ASSESSMENT:  CLINICAL IMPRESSION: 08/23/2022 Tolerated dry needling much better on this date reporting reduced tension in right hip.  Educated patient on isometrics to perform today and going forward to help reduce tension in these muscles.  Continue to discuss importance of soft tissue mobilization versus stretching secondary to instability in hip tightening up after aggressive stretching.  Patient to go to [place] for the next couple of weeks and hiking.  Discussed strategies to help manage discomfort and pain.  Will follow-up with patient when she returns from trip.  Patient will continue benefit from skilled PT at this time.  Eval: Patient presents to skilled physical therapy with complaints of chronic right hip pain that has worsened over the last year.  Patient was performing barre classes but this was painful and is not currently performing any strength training exercises at this time.  Despite this patient is highly active walking her dog and hiking up to 10 miles at a time.  These activities are painful especially afterwards which is concerning as patient has backpacking trip to [place] in July of this year.  Patient demonstrates significant weakness and instability throughout lower extremities and trunk and would greatly benefit from skilled physical therapy at this time to improve overall function and reduce risk of further injury.  OBJECTIVE IMPAIRMENTS: decreased activity tolerance, decreased balance, difficulty walking, decreased ROM, decreased strength, improper body mechanics, postural dysfunction, and pain.   ACTIVITY LIMITATIONS: standing, squatting, sleeping, stairs, and locomotion level  PARTICIPATION LIMITATIONS: cleaning, shopping, and community activity  PERSONAL FACTORS: Fitness and Time since onset of injury/illness/exacerbation are also affecting patient's functional outcome.   REHAB POTENTIAL: Good  CLINICAL DECISION MAKING:  Stable/uncomplicated  EVALUATION COMPLEXITY: Low   GOALS: Goals reviewed with patient? yes  SHORT TERM GOALS: Target date: 09/12/2022  Patient will be independent in self management strategies to improve quality of life and functional outcomes. Baseline: New Program Goal status: INITIAL  2.  Patient will report at least 50% improvement in overall symptoms and/or function to demonstrate improved functional mobility Baseline: 0% better Goal status: INITIAL  3.  Patient will be able to perform side-lying hip abduction at wall without pain to demonstrate improved hip function. Baseline: Painful unable Goal status: INITIAL  4.  Patient will be able to demonstrate hip hinge with good form and no pain. Baseline: Unable. Goal status: INITIAL    LONG TERM GOALS: Target date: 10/24/2022   Patient will report at least 75% improvement in overall symptoms and/or function to demonstrate improved functional mobility Baseline: 0% better Goal status: INITIAL  2.  Patient will improve score on FOTO outcomes measure to projected score to demonstrate overall improved function and QOL. Baseline: see above Goal status: INITIAL  3.  Patient was able to perform step down test with good control of hip and knee to reduce strain on lower extremity improve ability to hike up in the mountains. Baseline: Poor control bilaterally right greater than left Goal status: INITIAL  4.  Patient will demonstrate at least 4/5 MMT strength in bilateral lower extremities Baseline: See above Goal status: INITIAL    PLAN:  PT FREQUENCY: 1-2x/week for total of 16 visits over 12 week certifcation  PT DURATION: 12 weeks  PLANNED INTERVENTIONS: Therapeutic exercises, Therapeutic activity, Neuromuscular re-education, Balance training, Gait training, Patient/Family education, Self Care, Joint mobilization, Joint manipulation, Stair training, Vestibular training, Canalith repositioning, Orthotic/Fit training,  Prosthetic training, DME instructions, Aquatic Therapy, Dry Needling, Electrical stimulation, Spinal manipulation, Spinal mobilization, Cryotherapy, Moist  heat, Taping, Traction, Ultrasound, Ionotophoresis 4mg /ml Dexamethasone, Manual therapy, and Re-evaluation.   PLAN FOR NEXT SESSION: f/u long exhale/pelvic tilts/sit bone focus/hip hinge hip iso, stability, core work (breathing- lower rib decompression) hip hinge, pain management strategies, sit bones and posture  dry needling f/u - KT tape    2:56 PM, 08/23/22 Tereasa Coop, DPT Physical Therapy with [facility]

## 2022-09-12 ENCOUNTER — Encounter: Payer: Medicare Other | Admitting: Physical Therapy

## 2022-09-14 ENCOUNTER — Encounter: Payer: Medicare Other | Admitting: Physical Therapy

## 2022-09-19 ENCOUNTER — Ambulatory Visit: Payer: Medicare Other | Admitting: Physical Therapy

## 2022-09-19 ENCOUNTER — Encounter: Payer: Self-pay | Admitting: Physical Therapy

## 2022-09-19 DIAGNOSIS — M6281 Muscle weakness (generalized): Secondary | ICD-10-CM | POA: Diagnosis not present

## 2022-09-19 DIAGNOSIS — M25551 Pain in right hip: Secondary | ICD-10-CM | POA: Diagnosis not present

## 2022-09-19 DIAGNOSIS — R262 Difficulty in walking, not elsewhere classified: Secondary | ICD-10-CM

## 2022-09-19 NOTE — Therapy (Signed)
OUTPATIENT PHYSICAL THERAPY LOWER EXTREMITY TREATMENT   Patient Name: Amber Schultz MRN: [id number] DOB:01/12/1986, 37 y.o., female Today's Date: 09/19/2022  END OF SESSION:  PT End of Session - 09/19/22 1221     Visit Number 8    Number of Visits 16    Date for PT Re-Evaluation 10/24/22    Authorization Type [employer]BS medicare    Progress Note Due on Visit 18    PT Start Time 1221   late to checkin   PT Stop Time 1259    PT Time Calculation (min) 38 min    Activity Tolerance Patient limited by pain    Behavior During Therapy [place] for tasks assessed/performed              Past Medical History:  Diagnosis Date   Allergy    Anxiety    Bipolar 1 disorder (HCC)    Depression    Goiter    History reviewed. No pertinent surgical history. Patient Active Problem List   Diagnosis Date Noted   Neck pain 08/02/2017   Musculoskeletal pain 08/02/2017   Muscle spasm 08/02/2017   Anxiety 05/18/2015   Bipolar I disorder (HCC) 10/10/2012    PCP:  Georgina Quint, MD   REFERRING PROVIDER:  Rodolph Bong, MD- [facility]  REFERRING DIAG: [phone] (ICD-10-CM) - Pain in joint of right hip   THERAPY DIAG:  Pain in right hip  Muscle weakness (generalized)  Difficulty in walking, not elsewhere classified  Rationale for Evaluation and Treatment: Rehabilitation  ONSET DATE: a year ago  SUBJECTIVE:   SUBJECTIVE STATEMENT: 09/19/2022 States that she didn't have any pain on her trip. States that she used the poles and heavy duty hiking boot. States crossing rocks on rivers as she didn't trust her leg. States she didn't have numbness but the last few days of the trip she did get some right toe numbness. Hip is tight. States she feels about 40% better.  Eval: Patient reports that her right hip has been bothering her for over a year.  It is started to hurt when she is performing pure barre classes and she is unable to do most the moves but they did in class.  Hip continued to  bother her even with modification so she stopped going to barre classes.  She tried going to a chiropractor which helped with her mid back pain but did not help with her hip pain.  In fact manipulations of hip actually increased hip pain afterwards.  Reports she likes to be very active and likes to walk her dog and hike.  However her dog is younger and does tend to pull and she does not know if this is contributing to her current issue.  Her last hike was in [place] in snow and ice which she wore snow shoes and cramps and afterwards she was in a lots of pain.  Oddly a few weeks ago she was in [place] and walked over 10 miles a day and had absolutely no pain during or afterwards.  She is heading to [place] July 1 and will be backpacking for a week and then exploring the country for another week afterwards and does not want to be in debilitating pain during her trip.  In addition to the pain in her hip she has right foot numbness that comes on when she has right hip pain.  Numbness does not extend through the leg and is isolated in the entire foot.  She tends to hold her  leg up a lot in a sitting position as this helps alleviate her symptoms.  PERTINENT HISTORY: Mild scoliosis, bipolar PAIN:  Are you having pain? Irritation to right hip 7/10   PRECAUTIONS: None  WEIGHT BEARING RESTRICTIONS: No  FALLS:  Has patient fallen in last 6 months? 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  In sitting she leans to the left and holds up her right leg in passive hip  flexion  PALPATION: Tenderness to palpation and increased resting tone noted in right glutes and left lumbar paraspinals, no pain with spring testing    LE Measurements Lower Extremity Right 09/19/22 Left 09/19/22   A/PROM MMT A/PROM MMT  Hip Flexion WFL 4- WFL 4+  Hip Extension 5* 4- 5 4  Hip Abduction 30 3+ 30 4  Hip Adduction  3+  4  Hip Internal rotation 30  WFL   Hip External rotation [facility]  WFL   Knee Flexion  4-  4  Knee Extension  4-  4  Ankle Dorsiflexion  4+  4+  Ankle Plantarflexion      Ankle Inversion      Ankle Eversion       (Blank rows = not tested) * pain    TODAY'S TREATMENT:                                                                                                                              DATE:   09/19/2022  Therapeutic Exercise:  Obj measures updated: Supine:  hip IR 3 minutes Prone:   Seated:    S/l: hip flexion with hip IR - 2x12 R, rev clamshells x12 Neuromuscular Re-education:    Manual Therapy: STM to right glutes, right QL - tolerated well, cupping to lumbar paraspinals  Therapeutic Activity:     Self Care:  Modalities:    PATIENT EDUCATION:  Education details: on use of high tops, reducing dog pulling on progress made Person educated: Patient Education method: Explanation, Demonstration, and Handouts Education comprehension: verbalized understanding   HOME EXERCISE PROGRAM: [id number]  ASSESSMENT:  CLINICAL IMPRESSION: 09/19/2022 Patient improving in overall ROM and pain levels. Tolerated session well and discussed continued use of high tops with walking. Progressed exercises and answered all questions. Reviewed manual interventions. Will continue with current POC as tolerated.   Eval: Patient presents to skilled physical therapy with complaints of chronic right hip pain that has worsened over the last year.  Patient was  performing barre classes but this was painful and is not currently performing any strength training  exercises at this time.  Despite this patient is highly active walking her dog and hiking up to 10 miles at a time.  These activities are painful especially afterwards which is concerning as patient has backpacking trip to [place] in July of this year.  Patient demonstrates significant weakness and instability throughout lower extremities and trunk and would greatly benefit from skilled physical therapy at this time to improve overall function and reduce risk of further injury.  OBJECTIVE IMPAIRMENTS: decreased activity tolerance, decreased balance, difficulty walking, decreased ROM, decreased strength, improper body mechanics, postural dysfunction, and pain.   ACTIVITY LIMITATIONS: standing, squatting, sleeping, stairs, and locomotion level  PARTICIPATION LIMITATIONS: cleaning, shopping, and community activity  PERSONAL FACTORS: Fitness and Time since onset of injury/illness/exacerbation are also affecting patient's functional outcome.   REHAB POTENTIAL: Good  CLINICAL DECISION MAKING: Stable/uncomplicated  EVALUATION COMPLEXITY: Low   GOALS: Goals reviewed with patient? yes  SHORT TERM GOALS: Target date: 09/12/2022  Patient will be independent in self management strategies to improve quality of life and functional outcomes. Baseline: New Program Goal status: Progressing   2.  Patient will report at least 50% improvement in overall symptoms and/or function to demonstrate improved functional mobility Baseline: 0% better Goal status: Progressing   3.  Patient will be able to perform side-lying hip abduction at wall without pain to demonstrate improved hip function. Baseline: Painful unable Goal status: MET  4.  Patient will be able to demonstrate hip hinge with good form and no pain. Baseline: Unable. Goal status: PROGRESSING    LONG TERM GOALS: Target date: 10/24/2022   Patient will report at least 75% improvement in overall symptoms and/or function to demonstrate improved  functional mobility Baseline: 0% better Goal status: PROGRESSING  2.  Patient will improve score on FOTO outcomes measure to projected score to demonstrate overall improved function and QOL. Baseline: see above Goal status: PROGRESSING  3.  Patient was able to perform step down test with good control of hip and knee to reduce strain on lower extremity improve ability to hike up in the mountains. Baseline: Poor control bilaterally right greater than left Goal status: PROGRESSING  4.  Patient will demonstrate at least 4/5 MMT strength in bilateral lower extremities Baseline: See above Goal status: PROGRESSING    PLAN:  PT FREQUENCY: 1-2x/week for total of 16 visits over 12 week certifcation  PT DURATION: 12 weeks  PLANNED INTERVENTIONS: Therapeutic exercises, Therapeutic activity, Neuromuscular re-education, Balance training, Gait training, Patient/Family education, Self Care, Joint mobilization, Joint manipulation, Stair training, Vestibular training, Canalith repositioning, Orthotic/Fit training, Prosthetic training, DME instructions, Aquatic Therapy, Dry Needling, Electrical stimulation, Spinal manipulation, Spinal mobilization, Cryotherapy, Moist heat, Taping, Traction, Ultrasound, Ionotophoresis 4mg /ml Dexamethasone, Manual therapy, and Re-evaluation.   PLAN FOR NEXT SESSION: f/u long exhale/pelvic tilts/sit bone focus/hip hinge hip iso, stability, core work (breathing- lower rib decompression) hip hinge, pain management strategies, sit bones and posture  dry needling f/u - KT tape    1:04 PM, 09/19/22 Tereasa Coop, DPT Physical Therapy with [facility]

## 2022-09-21 ENCOUNTER — Ambulatory Visit: Payer: Medicare Other | Admitting: Physical Therapy

## 2022-09-21 ENCOUNTER — Encounter: Payer: Self-pay | Admitting: Physical Therapy

## 2022-09-21 DIAGNOSIS — M6281 Muscle weakness (generalized): Secondary | ICD-10-CM | POA: Diagnosis not present

## 2022-09-21 DIAGNOSIS — M25551 Pain in right hip: Secondary | ICD-10-CM

## 2022-09-21 DIAGNOSIS — R262 Difficulty in walking, not elsewhere classified: Secondary | ICD-10-CM

## 2022-09-21 NOTE — Therapy (Signed)
OUTPATIENT PHYSICAL THERAPY LOWER EXTREMITY TREATMENT   Patient Name: Amber Schultz MRN: [id number] DOB:03-06-1985, 37 y.o., female Today's Date: 09/21/2022  END OF SESSION:  PT End of Session - 09/21/22 1214     Visit Number 9    Number of Visits 16    Date for PT Re-Evaluation 10/24/22    Authorization Type [employer] medicare    Progress Note Due on Visit 18    PT Start Time 1216    PT Stop Time 1304    PT Time Calculation (min) 48 min    Activity Tolerance Patient limited by pain    Behavior During Therapy WFL for tasks assessed/performed              Past Medical History:  Diagnosis Date   Allergy    Anxiety    Bipolar 1 disorder (HCC)    Depression    Goiter    History reviewed. No pertinent surgical history. Patient Active Problem List   Diagnosis Date Noted   Neck pain 08/02/2017   Musculoskeletal pain 08/02/2017   Muscle spasm 08/02/2017   Anxiety 05/18/2015   Bipolar I disorder (HCC) 10/10/2012    PCP:  Georgina Quint, MD   REFERRING PROVIDER:  Rodolph Bong, MD- [facility]  REFERRING DIAG: [phone] (ICD-10-CM) - Pain in joint of right hip   THERAPY DIAG:  Pain in right hip  Muscle weakness (generalized)  Difficulty in walking, not elsewhere classified  Rationale for Evaluation and Treatment: Rehabilitation  ONSET DATE: a year ago  SUBJECTIVE:   SUBJECTIVE STATEMENT: 09/21/2022 Toe is still numb and right hip is tight today. States she had some soreness after last session that lasted till that night.   Eval: Patient reports that her right hip has been bothering her for over a year.  It is started to hurt when she is performing pure barre classes and she is unable to do most the moves but they did in class.  Hip continued to bother her even with modification so she stopped going to barre classes.  She tried going to a chiropractor which helped with her mid back pain but did not help with her hip pain.  In fact manipulations of hip  actually increased hip pain afterwards.  Reports she likes to be very active and likes to walk her dog and hike.  However her dog is younger and does tend to pull and she does not know if this is contributing to her current issue.  Her last hike was in [place] in snow and ice which she wore snow shoes and cramps and afterwards she was in a lots of pain.  Oddly a few weeks ago she was in [place] and walked over 10 miles a day and had absolutely no pain during or afterwards.  She is heading to [place] July 1 and will be backpacking for a week and then exploring the country for another week afterwards and does not want to be in debilitating pain during her trip.  In addition to the pain in her hip she has right foot numbness that comes on when she has right hip pain.  Numbness does not extend through the leg and is isolated in the entire foot.  She tends to hold her leg up a lot in a sitting position as this helps alleviate her symptoms.  PERTINENT HISTORY: Mild scoliosis, bipolar PAIN:  Are you having pain? Irritation to right hip 6/10   PRECAUTIONS: None  WEIGHT BEARING RESTRICTIONS: No  FALLS:  Has patient fallen in last 6 months? 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  In sitting she leans to the left and holds up her right leg in passive hip flexion  PALPATION: Tenderness to palpation and increased resting tone noted in right glutes and left lumbar paraspinals, no pain with spring testing    LE Measurements Lower Extremity Right 09/19/22  Left 09/19/22   A/PROM MMT A/PROM MMT  Hip Flexion WFL 4- WFL 4+  Hip Extension 5* 4- 5 4  Hip Abduction 30 3+ 30 4  Hip Adduction  3+  4  Hip Internal rotation 30  WFL   Hip External rotation [facility]  WFL   Knee Flexion  4-  4  Knee Extension  4-  4  Ankle Dorsiflexion  4+  4+  Ankle Plantarflexion      Ankle Inversion      Ankle Eversion       (Blank rows = not tested) * pain    TODAY'S TREATMENT:                                                                                                                              DATE:   09/21/2022  Therapeutic Exercise:  Obj measures updated: Supine:  hip IR 3 minutes, LB lumbar extension over rolled towel 3 minutes, review of HEP, long exhale breahting Prone:   Seated:    S/l: hip flexion with hip IR - 2x12 R, rev clamshells x12 Neuromuscular Re-education: controlled hip IR - 8 minutes B, postures/positions for school, ball chair, ball, lumbar support, walking pad - 15 minutes   Manual Therapy: STM to right glutes, piriformis Therapeutic Activity:  Trigger Point Dry-Needling  Treatment instructions: Expect mild to moderate muscle soreness. S/S of pneumothorax if dry needled over a lung field, and to seek immediate medical attention should they occur. Patient verbalized understanding of these instructions and education.  Patient Consent Given: Yes Education handout provided: Previously provided Muscles treated: right glutes Electrical stimulation performed: No Parameters: N/A Treatment response/outcome: reduced tension, soreness    PATIENT EDUCATION:  Education details: on importance of reducing stress on hip/back and posture/positioning, on benefits fo infrared/red light therapy/sauna Person educated: Patient Education method: Explanation, Demonstration, and Handouts Education comprehension: verbalized understanding   HOME EXERCISE PROGRAM: [id number]  ASSESSMENT:  CLINICAL IMPRESSION: 09/21/2022 Session focused on  education of compensatory strategies with hip IR with patient moving into lumbar rotation. Discussed  sitting throughout the day and importance of sitting in more anterior pelvic tilt with support and getting out of sitting position as much as possible. Continued with dry needling, soreness noted afterwards but no pain. Will continue with current POC as tolerated.   Eval: Patient presents to skilled physical therapy with complaints of chronic right hip pain that has worsened over the last year.  Patient was performing barre classes but this was painful and is not currently performing any strength training exercises at this time.  Despite this patient is highly active walking her dog and hiking up to 10 miles at a time.  These activities are painful especially afterwards which is concerning as patient has backpacking trip to [place] in July of this year.  Patient demonstrates significant weakness and instability throughout lower extremities and trunk and would greatly benefit from skilled physical therapy at this time to improve overall function and reduce risk of further injury.  OBJECTIVE IMPAIRMENTS: decreased activity tolerance, decreased balance, difficulty walking, decreased ROM, decreased strength, improper body mechanics, postural dysfunction, and pain.   ACTIVITY LIMITATIONS: standing, squatting, sleeping, stairs, and locomotion level  PARTICIPATION LIMITATIONS: cleaning, shopping, and community activity  PERSONAL FACTORS: Fitness and Time since onset of injury/illness/exacerbation are also affecting patient's functional outcome.   REHAB POTENTIAL: Good  CLINICAL DECISION MAKING: Stable/uncomplicated  EVALUATION COMPLEXITY: Low   GOALS: Goals reviewed with patient? yes  SHORT TERM GOALS: Target date: 09/12/2022  Patient will be independent in self management strategies to improve quality of life and functional outcomes. Baseline: New Program Goal status: Progressing   2.  Patient will  report at least 50% improvement in overall symptoms and/or function to demonstrate improved functional mobility Baseline: 0% better Goal status: Progressing   3.  Patient will be able to perform side-lying hip abduction at wall without pain to demonstrate improved hip function. Baseline: Painful unable Goal status: MET  4.  Patient will be able to demonstrate hip hinge with good form and no pain. Baseline: Unable. Goal status: PROGRESSING    LONG TERM GOALS: Target date: 10/24/2022   Patient will report at least 75% improvement in overall symptoms and/or function to demonstrate improved functional mobility Baseline: 0% better Goal status: PROGRESSING  2.  Patient will improve score on FOTO outcomes measure to projected score to demonstrate overall improved function and QOL. Baseline: see above Goal status: PROGRESSING  3.  Patient was able to perform step down test with good control of hip and knee to reduce strain on lower extremity improve ability to hike up in the mountains. Baseline: Poor control bilaterally right greater than left Goal status: PROGRESSING  4.  Patient will demonstrate at least 4/5 MMT strength in bilateral lower extremities Baseline: See above Goal status: PROGRESSING    PLAN:  PT FREQUENCY: 1-2x/week for total of 16 visits over 12 week certifcation  PT DURATION: 12 weeks  PLANNED INTERVENTIONS: Therapeutic exercises, Therapeutic activity, Neuromuscular re-education, Balance training, Gait training, Patient/Family education, Self Care, Joint mobilization, Joint manipulation, Stair training, Vestibular training, Canalith repositioning, Orthotic/Fit training, Prosthetic training, DME instructions, Aquatic Therapy, Dry Needling, Electrical stimulation, Spinal manipulation, Spinal mobilization, Cryotherapy, Moist heat, Taping, Traction, Ultrasound, Ionotophoresis 4mg /ml Dexamethasone, Manual therapy, and Re-evaluation.   PLAN FOR NEXT SESSION: f/u long  exhale/pelvic tilts/sit bone focus/hip hinge hip iso, stability, core work (breathing- lower rib decompression) hip hinge, pain management strategies, sit bones and posture  dry needling f/u - KT tape    1:34 PM, 09/21/22 Tereasa Coop, DPT Physical Therapy  with [facility]

## 2022-09-26 ENCOUNTER — Ambulatory Visit: Payer: Medicare Other | Admitting: Physical Therapy

## 2022-09-26 ENCOUNTER — Encounter: Payer: Self-pay | Admitting: Physical Therapy

## 2022-09-26 DIAGNOSIS — R262 Difficulty in walking, not elsewhere classified: Secondary | ICD-10-CM | POA: Diagnosis not present

## 2022-09-26 DIAGNOSIS — M6281 Muscle weakness (generalized): Secondary | ICD-10-CM

## 2022-09-26 DIAGNOSIS — M25551 Pain in right hip: Secondary | ICD-10-CM | POA: Diagnosis not present

## 2022-09-26 NOTE — Therapy (Signed)
OUTPATIENT PHYSICAL THERAPY LOWER EXTREMITY TREATMENT   Patient Name: Amber Schultz MRN: [id number] DOB:[birth year], 37 y.o., female Today's Date: 09/26/2022  END OF SESSION:  PT End of Session - 09/26/22 1106     Visit Number 10    Number of Visits 16    Date for PT Re-Evaluation 10/24/22    Authorization Type [employer] medicare    Progress Note Due on Visit 18    PT Start Time 1106   late to check in   PT Stop Time 1146    PT Time Calculation (min) 40 min    Activity Tolerance Patient limited by pain    Behavior During Therapy [facility] for tasks assessed/performed              Past Medical History:  Diagnosis Date   Allergy    Anxiety    Bipolar 1 disorder (HCC)    Depression    Goiter    History reviewed. No pertinent surgical history. Patient Active Problem List   Diagnosis Date Noted   Neck pain 08/02/2017   Musculoskeletal pain 08/02/2017   Muscle spasm 08/02/2017   Anxiety 05/18/2015   Bipolar I disorder (HCC) 10/10/2012    PCP:  Georgina Quint, MD   REFERRING PROVIDER:  Rodolph Bong, MD- [facility]  REFERRING DIAG: [phone] (ICD-10-CM) - Pain in joint of right hip   THERAPY DIAG:  Pain in right hip  Muscle weakness (generalized)  Difficulty in walking, not elsewhere classified  Rationale for Evaluation and Treatment: Rehabilitation  ONSET DATE: a year ago  SUBJECTIVE:   SUBJECTIVE STATEMENT: 09/26/2022 States she is overwhelmed - she has a lot of exercises and doesn't know what to focus on. States she is not having a lot of pain.  Eval: Patient reports that her right hip has been bothering her for over a year.  It is started to hurt when she is performing pure barre classes and she is unable to do most the moves but they did in class.  Hip continued to bother her even with modification so she stopped going to barre classes.  She tried going to a chiropractor which helped with her mid back pain but did not help with her hip pain.  In fact  manipulations of hip actually increased hip pain afterwards.  Reports she likes to be very active and likes to walk her dog and hike.  However her dog is younger and does tend to pull and she does not know if this is contributing to her current issue.  Her last hike was in [place] in snow and ice which she wore snow shoes and cramps and afterwards she was in a lots of pain.  Oddly a few weeks ago she was in [place] and walked over 10 miles a day and had absolutely no pain during or afterwards.  She is heading to [place] July 1 and will be backpacking for a week and then exploring the country for another week afterwards and does not want to be in debilitating pain during her trip.  In addition to the pain in her hip she has right foot numbness that comes on when she has right hip pain.  Numbness does not extend through the leg and is isolated in the entire foot.  She tends to hold her leg up a lot in a sitting position as this helps alleviate her symptoms.  PERTINENT HISTORY: Mild scoliosis, bipolar PAIN:  Are you having pain? Irritation to right hip 0/10  PRECAUTIONS: None  WEIGHT BEARING RESTRICTIONS: No  FALLS:  Has patient fallen in last 6 months? 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  In sitting she leans to the left and holds up her right leg in passive hip flexion  PALPATION: Tenderness to palpation and increased resting tone noted in right glutes and left lumbar paraspinals, no pain with spring testing    LE Measurements Lower  Extremity Right 09/19/22 Left 09/19/22   A/PROM MMT A/PROM MMT  Hip Flexion WFL 4- WFL 4+  Hip Extension 5* 4- 5 4  Hip Abduction 30 3+ 30 4  Hip Adduction  3+  4  Hip Internal rotation 30  WFL   Hip External rotation [facility]  WFL   Knee Flexion  4-  4  Knee Extension  4-  4  Ankle Dorsiflexion  4+  4+  Ankle Plantarflexion      Ankle Inversion      Ankle Eversion       (Blank rows = not tested) * pain    TODAY'S TREATMENT:                                                                                                                              DATE:   09/26/2022  Therapeutic Exercise: Supine:  hip IR 1 minutes x2, pelvic tilts 2 minutes, hip abd iso 1 minute with 5" holds, hip abd bridge 2x 10, hip add iso x10 5" holds B Long sitting: hip flexion 4x5 slow and controlled - on blue foam    Seated:  pelvic rotations with ball 5 minutes - difficult, hip add x5 5" holds  S/l: clamshells 2x10 R, trunk rotation stretch 10" holds x5 R Neuromuscular Re-education:   Manual Therapy:     PATIENT EDUCATION:  Education details: on HEP and what to focus on with HEP Person educated: Patient Education method: Explanation, Demonstration, and Handouts Education comprehension: verbalized understanding   HOME EXERCISE PROGRAM: [id number]  ASSESSMENT:  CLINICAL IMPRESSION: 09/26/2022 Progressed exercises as able. Reviewed entire HEP and went over medbridge app for reduced stress and ease of access to HEP. Slight tightness after s/l exercises but this reduced with hip add isometric. Tried pelvic rotations sitting and this was challenging, will f/u next session. Will continue with current POC as tolerated.   Eval: Patient presents to skilled physical therapy with complaints of chronic right hip pain that has worsened over the last year.  Patient  was performing barre classes but this was painful and is not currently performing any strength training exercises at this time.  Despite this patient is  highly active walking her dog and hiking up to 10 miles at a time.  These activities are painful especially afterwards which is concerning as patient has backpacking trip to [place] in July of this year.  Patient demonstrates significant weakness and instability throughout lower extremities and trunk and would greatly benefit from skilled physical therapy at this time to improve overall function and reduce risk of further injury.  OBJECTIVE IMPAIRMENTS: decreased activity tolerance, decreased balance, difficulty walking, decreased ROM, decreased strength, improper body mechanics, postural dysfunction, and pain.   ACTIVITY LIMITATIONS: standing, squatting, sleeping, stairs, and locomotion level  PARTICIPATION LIMITATIONS: cleaning, shopping, and community activity  PERSONAL FACTORS: Fitness and Time since onset of injury/illness/exacerbation are also affecting patient's functional outcome.   REHAB POTENTIAL: Good  CLINICAL DECISION MAKING: Stable/uncomplicated  EVALUATION COMPLEXITY: Low   GOALS: Goals reviewed with patient? yes  SHORT TERM GOALS: Target date: 09/12/2022  Patient will be independent in self management strategies to improve quality of life and functional outcomes. Baseline: New Program Goal status: Progressing   2.  Patient will report at least 50% improvement in overall symptoms and/or function to demonstrate improved functional mobility Baseline: 0% better Goal status: Progressing   3.  Patient will be able to perform side-lying hip abduction at wall without pain to demonstrate improved hip function. Baseline: Painful unable Goal status: MET  4.  Patient will be able to demonstrate hip hinge with good form and no pain. Baseline: Unable. Goal status: PROGRESSING    LONG TERM GOALS: Target date: 10/24/2022   Patient will report at least 75% improvement in overall symptoms and/or function to demonstrate improved functional mobility Baseline: 0% better Goal  status: PROGRESSING  2.  Patient will improve score on FOTO outcomes measure to projected score to demonstrate overall improved function and QOL. Baseline: see above Goal status: PROGRESSING  3.  Patient was able to perform step down test with good control of hip and knee to reduce strain on lower extremity improve ability to hike up in the mountains. Baseline: Poor control bilaterally right greater than left Goal status: PROGRESSING  4.  Patient will demonstrate at least 4/5 MMT strength in bilateral lower extremities Baseline: See above Goal status: PROGRESSING    PLAN:  PT FREQUENCY: 1-2x/week for total of 16 visits over 12 week certifcation  PT DURATION: 12 weeks  PLANNED INTERVENTIONS: Therapeutic exercises, Therapeutic activity, Neuromuscular re-education, Balance training, Gait training, Patient/Family education, Self Care, Joint mobilization, Joint manipulation, Stair training, Vestibular training, Canalith repositioning, Orthotic/Fit training, Prosthetic training, DME instructions, Aquatic Therapy, Dry Needling, Electrical stimulation, Spinal manipulation, Spinal mobilization, Cryotherapy, Moist heat, Taping, Traction, Ultrasound, Ionotophoresis 4mg /ml Dexamethasone, Manual therapy, and Re-evaluation.   PLAN FOR NEXT SESSION: f/u long exhale/pelvic tilts/sit bone focus/hip hinge hip iso, stability, core work (breathing- lower rib decompression) hip hinge, pain management strategies, sit bones and posture  dry needling f/u - KT tape    11:51 AM, 09/26/22 Tereasa Coop, DPT Physical Therapy with [facility]

## 2022-09-28 ENCOUNTER — Ambulatory Visit: Payer: Medicare Other | Admitting: Physical Therapy

## 2022-09-28 ENCOUNTER — Encounter: Payer: Self-pay | Admitting: Physical Therapy

## 2022-09-28 DIAGNOSIS — M6281 Muscle weakness (generalized): Secondary | ICD-10-CM

## 2022-09-28 DIAGNOSIS — F319 Bipolar disorder, unspecified: Secondary | ICD-10-CM | POA: Diagnosis not present

## 2022-09-28 DIAGNOSIS — E538 Deficiency of other specified B group vitamins: Secondary | ICD-10-CM | POA: Diagnosis not present

## 2022-09-28 DIAGNOSIS — R262 Difficulty in walking, not elsewhere classified: Secondary | ICD-10-CM

## 2022-09-28 DIAGNOSIS — M25551 Pain in right hip: Secondary | ICD-10-CM

## 2022-09-28 NOTE — Therapy (Signed)
OUTPATIENT PHYSICAL THERAPY LOWER EXTREMITY TREATMENT   Patient Name: Amber Schultz MRN: [id number] DOB:06-21-1985, 37 y.o., female Today's Date: 09/28/2022  END OF SESSION:  PT End of Session - 09/28/22 1025     Visit Number 11    Number of Visits 16    Date for PT Re-Evaluation 10/24/22    Authorization Type [employer] medicare    Progress Note Due on Visit 18    PT Start Time 1025   late to check in   PT Stop Time 1107    PT Time Calculation (min) 42 min    Activity Tolerance Patient limited by pain    Behavior During Therapy [facility] for tasks assessed/performed              Past Medical History:  Diagnosis Date   Allergy    Anxiety    Bipolar 1 disorder (HCC)    Depression    Goiter    History reviewed. No pertinent surgical history. Patient Active Problem List   Diagnosis Date Noted   Neck pain 08/02/2017   Musculoskeletal pain 08/02/2017   Muscle spasm 08/02/2017   Anxiety 05/18/2015   Bipolar I disorder (HCC) 10/10/2012    PCP:  Georgina Quint, MD   REFERRING PROVIDER:  Rodolph Bong, MD- [facility]  REFERRING DIAG: [phone] (ICD-10-CM) - Pain in joint of right hip   THERAPY DIAG:  Pain in right hip  Muscle weakness (generalized)  Difficulty in walking, not elsewhere classified  Rationale for Evaluation and Treatment: Rehabilitation  ONSET DATE: a year ago  SUBJECTIVE:   SUBJECTIVE STATEMENT: 09/28/2022 States her hip is really tight and after last session started feeling more type.   Eval: Patient reports that her right hip has been bothering her for over a year.  It is started to hurt when she is performing pure barre classes and she is unable to do most the moves but they did in class.  Hip continued to bother her even with modification so she stopped going to barre classes.  She tried going to a chiropractor which helped with her mid back pain but did not help with her hip pain.  In fact manipulations of hip actually increased hip pain  afterwards.  Reports she likes to be very active and likes to walk her dog and hike.  However her dog is younger and does tend to pull and she does not know if this is contributing to her current issue.  Her last hike was in [place] in snow and ice which she wore snow shoes and cramps and afterwards she was in a lots of pain.  Oddly a few weeks ago she was in [place] and walked over 10 miles a day and had absolutely no pain during or afterwards.  She is heading to [place] July 1 and will be backpacking for a week and then exploring the country for another week afterwards and does not want to be in debilitating pain during her trip.  In addition to the pain in her hip she has right foot numbness that comes on when she has right hip pain.  Numbness does not extend through the leg and is isolated in the entire foot.  She tends to hold her leg up a lot in a sitting position as this helps alleviate her symptoms.  PERTINENT HISTORY: Mild scoliosis, bipolar PAIN:  Are you having pain? Irritation to right hip 7/10   PRECAUTIONS: None  WEIGHT BEARING RESTRICTIONS: No  FALLS:  Has patient  fallen in last 6 months? 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  In sitting she leans to the left and holds up her right leg in passive hip flexion  PALPATION: Tenderness to palpation and increased resting tone noted in right glutes and left lumbar paraspinals, no pain with spring testing    LE Measurements Lower Extremity Right 09/19/22 Left 09/19/22   A/PROM MMT  A/PROM MMT  Hip Flexion WFL 4- WFL 4+  Hip Extension 5* 4- 5 4  Hip Abduction 30 3+ 30 4  Hip Adduction  3+  4  Hip Internal rotation 30  WFL   Hip External rotation [facility]  WFL   Knee Flexion  4-  4  Knee Extension  4-  4  Ankle Dorsiflexion  4+  4+  Ankle Plantarflexion      Ankle Inversion      Ankle Eversion       (Blank rows = not tested) * pain    TODAY'S TREATMENT:                                                                                                                              DATE:   09/28/2022  Therapeutic Exercise: Supine:   scapular protraction x5 10" holds B Long sitting:   Quad: plank - x5 10" holds, regular plank too challenging,   Seated:  reviewed entire HEP   S/l:   Neuromuscular Re-education:  posture - sit bones, scap protraction, core activation - practice - proper height chair - 15 minutes, quadruped - isolated scapular protraction 10 minutes - tactile and verbal cues, lateral costal breathing with belt 10 minutes Manual Therapy:     PATIENT EDUCATION:  Education details: on HEP and continued posture adjustments and  Person educated: Patient Education method: Explanation, Demonstration, and Handouts Education comprehension: verbalized understanding   HOME EXERCISE PROGRAM: [id number]  ASSESSMENT:  CLINICAL IMPRESSION: 09/28/2022 Focused on isolated muscle activation and education on rationale behind this. Discussed and practiced importance of more active posture as patient likes to shorten muscles and sit passively. Educated patient in typical stretching and reducing intensity but not avoiding these exercises. Overall improved control noted with verbal and tactile cues. Will continue with current POC as tolerated.   Eval: Patient presents to skilled physical therapy with complaints of chronic right hip pain that has worsened over the last year.  Patient was performing barre classes but this was painful and is not  currently performing any strength  training exercises at this time.  Despite this patient is highly active walking her dog and hiking up to 10 miles at a time.  These activities are painful especially afterwards which is concerning as patient has backpacking trip to [place] in July of this year.  Patient demonstrates significant weakness and instability throughout lower extremities and trunk and would greatly benefit from skilled physical therapy at this time to improve overall function and reduce risk of further injury.  OBJECTIVE IMPAIRMENTS: decreased activity tolerance, decreased balance, difficulty walking, decreased ROM, decreased strength, improper body mechanics, postural dysfunction, and pain.   ACTIVITY LIMITATIONS: standing, squatting, sleeping, stairs, and locomotion level  PARTICIPATION LIMITATIONS: cleaning, shopping, and community activity  PERSONAL FACTORS: Fitness and Time since onset of injury/illness/exacerbation are also affecting patient's functional outcome.   REHAB POTENTIAL: Good  CLINICAL DECISION MAKING: Stable/uncomplicated  EVALUATION COMPLEXITY: Low   GOALS: Goals reviewed with patient? yes  SHORT TERM GOALS: Target date: 09/12/2022  Patient will be independent in self management strategies to improve quality of life and functional outcomes. Baseline: New Program Goal status: Progressing   2.  Patient will report at least 50% improvement in overall symptoms and/or function to demonstrate improved functional mobility Baseline: 0% better Goal status: Progressing   3.  Patient will be able to perform side-lying hip abduction at wall without pain to demonstrate improved hip function. Baseline: Painful unable Goal status: MET  4.  Patient will be able to demonstrate hip hinge with good form and no pain. Baseline: Unable. Goal status: PROGRESSING    LONG TERM GOALS: Target date: 10/24/2022   Patient will report at least 75% improvement in overall symptoms and/or function to demonstrate  improved functional mobility Baseline: 0% better Goal status: PROGRESSING  2.  Patient will improve score on FOTO outcomes measure to projected score to demonstrate overall improved function and QOL. Baseline: see above Goal status: PROGRESSING  3.  Patient was able to perform step down test with good control of hip and knee to reduce strain on lower extremity improve ability to hike up in the mountains. Baseline: Poor control bilaterally right greater than left Goal status: PROGRESSING  4.  Patient will demonstrate at least 4/5 MMT strength in bilateral lower extremities Baseline: See above Goal status: PROGRESSING    PLAN:  PT FREQUENCY: 1-2x/week for total of 16 visits over 12 week certifcation  PT DURATION: 12 weeks  PLANNED INTERVENTIONS: Therapeutic exercises, Therapeutic activity, Neuromuscular re-education, Balance training, Gait training, Patient/Family education, Self Care, Joint mobilization, Joint manipulation, Stair training, Vestibular training, Canalith repositioning, Orthotic/Fit training, Prosthetic training, DME instructions, Aquatic Therapy, Dry Needling, Electrical stimulation, Spinal manipulation, Spinal mobilization, Cryotherapy, Moist heat, Taping, Traction, Ultrasound, Ionotophoresis 4mg /ml Dexamethasone, Manual therapy, and Re-evaluation.   PLAN FOR NEXT SESSION: f/u long exhale/pelvic tilts/sit bone focus/hip hinge hip iso, stability, core work (breathing- lower rib decompression) hip hinge, pain management strategies, sit bones and posture  dry needling f/u - KT tape    11:57 AM, 09/28/22 Tereasa Coop, DPT Physical Therapy with [facility]

## 2022-10-02 ENCOUNTER — Ambulatory Visit: Payer: Medicare Other | Admitting: Physical Therapy

## 2022-10-02 ENCOUNTER — Encounter: Payer: Self-pay | Admitting: Physical Therapy

## 2022-10-02 DIAGNOSIS — M25551 Pain in right hip: Secondary | ICD-10-CM

## 2022-10-02 DIAGNOSIS — M6281 Muscle weakness (generalized): Secondary | ICD-10-CM

## 2022-10-02 DIAGNOSIS — R262 Difficulty in walking, not elsewhere classified: Secondary | ICD-10-CM

## 2022-10-02 NOTE — Therapy (Addendum)
 OUTPATIENT PHYSICAL THERAPY LOWER EXTREMITY TREATMENT PHYSICAL THERAPY DISCHARGE SUMMARY  Visits from Start of Care: 12  Current functional level related to goals / functional outcomes: Could not reassess due to unplanned discharged    Remaining deficits: Could not reassess due to unplanned discharged    Education / Equipment: Could not reassess due to unplanned discharged    Patient agrees to discharge. Patient goals were partially met. Patient is being discharged due to not returning since the last visit.  8:23 AM, 05/09/23 Tereasa Coop, DPT Physical Therapy with [facility]    Patient Name: Amber Schultz MRN: [id number] DOB:[birth year], 37 y.o., female Today's Date: 10/02/2022  END OF SESSION:  PT End of Session - 10/02/22 1021     Visit Number 12    Number of Visits 16    Date for PT Re-Evaluation 10/24/22    Authorization Type [employer] medicare    Progress Note Due on Visit 18    PT Start Time 1021   late to check in   PT Stop Time 1100    PT Time Calculation (min) 39 min    Activity Tolerance Patient limited by pain    Behavior During Therapy [facility] for tasks assessed/performed              Past Medical History:  Diagnosis Date   Allergy    Anxiety    Bipolar 1 disorder (HCC)    Depression    Goiter    History reviewed. No pertinent surgical history. Patient Active Problem List   Diagnosis Date Noted   Neck pain 08/02/2017   Musculoskeletal pain 08/02/2017   Muscle spasm 08/02/2017   Anxiety 05/18/2015   Bipolar I disorder (HCC) 10/10/2012    PCP:  Georgina Quint, MD   REFERRING PROVIDER:  Rodolph Bong, MD- [facility]  REFERRING DIAG: [phone] (ICD-10-CM) - Pain in joint of right hip   THERAPY DIAG:  Pain in right hip  Muscle weakness (generalized)  Difficulty in walking, not elsewhere classified  Rationale for Evaluation and Treatment: Rehabilitation  ONSET DATE: a year ago  SUBJECTIVE:   SUBJECTIVE  STATEMENT: 10/02/2022 States she was sitting for 5 hours in a booth seat and was stiff afterwards. States that nothing helped. States she tried to walk around but was in the car a bit.   Eval: Patient reports that her right hip has been bothering her for over a year.  It is started to hurt when she is performing pure barre classes and she is unable to do most the moves but they did in class.  Hip continued to bother her even with modification so she stopped going to barre classes.  She tried going to a chiropractor which helped with her mid back pain but did not help with her hip pain.  In fact manipulations of hip actually increased hip pain afterwards.  Reports she likes to be very active and likes to walk her dog and hike.  However her dog is younger and does tend to pull and she does not know if this is contributing to her current issue.  Her last hike was in [place] in snow and ice which she wore snow shoes and cramps and afterwards she was in a lots of pain.  Oddly a few weeks ago she was in [place] and walked over 10 miles a day and had absolutely no pain during or afterwards.  She is heading to [place] July 1 and will be backpacking for a week and then exploring  the country for another week afterwards and does not want to be in debilitating pain during her trip.  In addition to the pain in her hip she has right foot numbness that comes on when she has right hip pain.  Numbness does not extend through the leg and is isolated in the entire foot.  She tends to hold her leg up a lot in a sitting position as this helps alleviate her symptoms.  PERTINENT HISTORY: Mild scoliosis, bipolar PAIN:  Are you having pain? Irritation to right hip 8/10 tightness   PRECAUTIONS: None  WEIGHT BEARING RESTRICTIONS: No  FALLS:  Has patient fallen in last 6 months? 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  In sitting she leans to the left and holds up her right leg in passive hip flexion  PALPATION: Tenderness to palpation and increased resting tone noted in right glutes and left lumbar paraspinals, no pain with spring testing    LE Measurements Lower Extremity Right 09/19/22 Left 09/19/22   A/PROM MMT A/PROM MMT  Hip Flexion WFL 4- WFL 4+  Hip Extension 5* 4- 5 4  Hip Abduction 30 3+ 30 4  Hip Adduction  3+  4  Hip Internal rotation 30  WFL   Hip External rotation [facility]  WFL   Knee Flexion  4-  4  Knee Extension  4-  4  Ankle Dorsiflexion  4+  4+  Ankle Plantarflexion      Ankle Inversion      Ankle Eversion       (Blank rows = not tested) * pain    TODAY'S TREATMENT:                                                                                                                              DATE:   10/02/2022  Therapeutic Exercise: Supine:    Long sitting:   Quad   Seated:  trunk ROT with right leg pressing down 3x5 10" holds B  S/l:   Neuromuscular Re-education:  pallof press green band 4x5 B 5" holds, standing posture with scap protraction/long exhale 5 minutes (excessive sway back at rest), on blue foam SLS x2 30" holds B, tandem x3 30" holds B, tandem with head turns 3x5 B Manual Therapy:     PATIENT EDUCATION:  Education details: on HEP, on postures/positions, on MD f/u and possible benefits of medication injection pending MD review Person educated: Patient Education method:  Explanation, Demonstration, and Handouts Education comprehension: verbalized understanding   HOME EXERCISE  PROGRAM: [id number]  ASSESSMENT:  CLINICAL IMPRESSION: 10/02/2022 Educated patient on possible benefits of MD f/u to determine if medication/injection would be helpful. Focused on standing stability exercises which were tolerated well. Fatigue in hips noted but overall tolerated well. Added seated trunk rotation as patient locks out in right lumbar ROT and SB, this felt like a stretch in right leg when rotating to the left. Added to HEP. Slight numbness noted after stretching but this started to resolve with rest. Continues to stand in excessive swayback position, cued patient for proper form and muscle activation.  Eval: Patient presents to skilled physical therapy with complaints of chronic right hip pain that has worsened over the last year.  Patient was performing barre classes but this was painful and is not currently performing any strength training exercises at this time.  Despite this patient is highly active walking her dog and hiking up to 10 miles at a time.  These activities are painful especially afterwards which is concerning as patient has backpacking trip to [place] in July of this year.  Patient demonstrates significant weakness and instability throughout lower extremities and trunk and would greatly benefit from skilled physical therapy at this time to improve overall function and reduce risk of further injury.  OBJECTIVE IMPAIRMENTS: decreased activity tolerance, decreased balance, difficulty walking, decreased ROM, decreased strength, improper body mechanics, postural dysfunction, and pain.   ACTIVITY LIMITATIONS: standing, squatting, sleeping, stairs, and locomotion level  PARTICIPATION LIMITATIONS: cleaning, shopping, and community activity  PERSONAL FACTORS: Fitness and Time since onset of injury/illness/exacerbation are also affecting patient's functional outcome.   REHAB POTENTIAL: Good  CLINICAL DECISION MAKING: Stable/uncomplicated  EVALUATION COMPLEXITY:  Low   GOALS: Goals reviewed with patient? yes  SHORT TERM GOALS: Target date: 09/12/2022  Patient will be independent in self management strategies to improve quality of life and functional outcomes. Baseline: New Program Goal status: Progressing   2.  Patient will report at least 50% improvement in overall symptoms and/or function to demonstrate improved functional mobility Baseline: 0% better Goal status: Progressing   3.  Patient will be able to perform side-lying hip abduction at wall without pain to demonstrate improved hip function. Baseline: Painful unable Goal status: MET  4.  Patient will be able to demonstrate hip hinge with good form and no pain. Baseline: Unable. Goal status: PROGRESSING    LONG TERM GOALS: Target date: 10/24/2022   Patient will report at least 75% improvement in overall symptoms and/or function to demonstrate improved functional mobility Baseline: 0% better Goal status: PROGRESSING  2.  Patient will improve score on FOTO outcomes measure to projected score to demonstrate overall improved function and QOL. Baseline: see above Goal status: PROGRESSING  3.  Patient was able to perform step down test with good control of hip and knee to reduce strain on lower extremity improve ability to hike up in the mountains. Baseline: Poor control bilaterally right greater than left Goal status: PROGRESSING  4.  Patient will demonstrate at least 4/5 MMT strength in bilateral lower extremities Baseline: See above Goal status: PROGRESSING    PLAN:  PT FREQUENCY: 1-2x/week for total of 16 visits over 12 week certifcation  PT DURATION: 12 weeks  PLANNED INTERVENTIONS: Therapeutic exercises, Therapeutic activity, Neuromuscular re-education, Balance training, Gait training, Patient/Family education, Self Care, Joint mobilization, Joint manipulation, Stair training, Vestibular training, Canalith repositioning, Orthotic/Fit training, Prosthetic training, DME  instructions, Aquatic Therapy, Dry Needling, Electrical  stimulation, Spinal manipulation, Spinal mobilization, Cryotherapy, Moist heat, Taping, Traction, Ultrasound, Ionotophoresis 4mg /ml Dexamethasone, Manual therapy, and Re-evaluation.   PLAN FOR NEXT SESSION: f/u long exhale/pelvic tilts/sit bone focus/hip hinge hip iso, stability, core work (breathing- lower rib decompression) hip hinge, pain management strategies, sit bones and posture  dry needling f/u - KT tape    11:07 AM, 10/02/22 Tereasa Coop, DPT Physical Therapy with [facility]

## 2022-10-04 ENCOUNTER — Encounter: Payer: Self-pay | Admitting: Emergency Medicine

## 2022-10-04 ENCOUNTER — Ambulatory Visit (INDEPENDENT_AMBULATORY_CARE_PROVIDER_SITE_OTHER): Payer: Medicare Other | Admitting: Emergency Medicine

## 2022-10-04 ENCOUNTER — Encounter: Payer: Medicare Other | Admitting: Physical Therapy

## 2022-10-04 VITALS — BP 110/72 | HR 79 | Temp 98.5°F | Ht 69.0 in | Wt 147.1 lb

## 2022-10-04 DIAGNOSIS — Z130 Encounter for screening for diseases of the blood and blood-forming organs and certain disorders involving the immune mechanism: Secondary | ICD-10-CM | POA: Diagnosis not present

## 2022-10-04 DIAGNOSIS — Z1159 Encounter for screening for other viral diseases: Secondary | ICD-10-CM

## 2022-10-04 DIAGNOSIS — Z13228 Encounter for screening for other metabolic disorders: Secondary | ICD-10-CM

## 2022-10-04 DIAGNOSIS — Z0000 Encounter for general adult medical examination without abnormal findings: Secondary | ICD-10-CM | POA: Diagnosis not present

## 2022-10-04 DIAGNOSIS — Z13220 Encounter for screening for lipoid disorders: Secondary | ICD-10-CM | POA: Diagnosis not present

## 2022-10-04 DIAGNOSIS — Z1329 Encounter for screening for other suspected endocrine disorder: Secondary | ICD-10-CM

## 2022-10-04 LAB — CBC WITH DIFFERENTIAL/PLATELET
Basophils Absolute: 0 10*3/uL (ref 0.0–0.1)
Basophils Relative: 0.7 % (ref 0.0–3.0)
Eosinophils Absolute: 0.1 10*3/uL (ref 0.0–0.7)
Eosinophils Relative: 3 % (ref 0.0–5.0)
HCT: 41 % (ref 36.0–46.0)
Hemoglobin: 13.2 g/dL (ref 12.0–15.0)
Lymphocytes Relative: 31.5 % (ref 12.0–46.0)
Lymphs Abs: 1.4 10*3/uL (ref 0.7–4.0)
MCHC: 32.3 g/dL (ref 30.0–36.0)
MCV: 90.1 fl (ref 78.0–100.0)
Monocytes Absolute: 0.5 10*3/uL (ref 0.1–1.0)
Monocytes Relative: 10.8 % (ref 3.0–12.0)
Neutro Abs: 2.3 10*3/uL (ref 1.4–7.7)
Neutrophils Relative %: 54 % (ref 43.0–77.0)
Platelets: 174 10*3/uL (ref 150.0–400.0)
RBC: 4.55 Mil/uL (ref 3.87–5.11)
RDW: 12.9 % (ref 11.5–15.5)
WBC: 4.3 10*3/uL (ref 4.0–10.5)

## 2022-10-04 LAB — COMPREHENSIVE METABOLIC PANEL
ALT: 16 U/L (ref 0–35)
AST: 23 U/L (ref 0–37)
Albumin: 4.1 g/dL (ref 3.5–5.2)
Alkaline Phosphatase: 66 U/L (ref 39–117)
BUN: 14 mg/dL (ref 6–23)
CO2: 31 mEq/L (ref 19–32)
Calcium: 9.3 mg/dL (ref 8.4–10.5)
Chloride: 105 mEq/L (ref 96–112)
Creatinine, Ser: 0.74 mg/dL (ref 0.40–1.20)
GFR: 103.36 mL/min (ref >60.00)
Glucose, Bld: 84 mg/dL (ref 70–99)
Potassium: 4 mEq/L (ref 3.5–5.1)
Sodium: 140 mEq/L (ref 135–145)
Total Bilirubin: 0.5 mg/dL (ref 0.2–1.2)
Total Protein: 6.8 g/dL (ref 6.0–8.3)

## 2022-10-04 LAB — LIPID PANEL
Cholesterol: 192 mg/dL (ref 0–200)
HDL: 77.3 mg/dL (ref >39.00)
LDL Cholesterol: 99 mg/dL (ref 0–99)
NonHDL: 114.2
Total CHOL/HDL Ratio: 2
Triglycerides: 78 mg/dL (ref 0.0–149.0)
VLDL: 15.6 mg/dL (ref 0.0–40.0)

## 2022-10-04 LAB — HEMOGLOBIN A1C: Hgb A1c MFr Bld: 5.3 % (ref 4.6–6.5)

## 2022-10-04 NOTE — Patient Instructions (Signed)

## 2022-10-04 NOTE — Progress Notes (Signed)
Amber Schultz 37 y.o.   Chief Complaint  Patient presents with   Annual Exam    Patient states her big toe on her left foot is numb x 3 weeks     HISTORY OF PRESENT ILLNESS: This is a 37 y.o. female here for annual exam. Went hiking to [place] last month.  Had COVID infection with mild symptoms upon returning. Also had numbness to left big toe for 2 to 3 weeks but now is getting better No other complaints or medical concerns today Healthy female with healthy lifestyle.  HPI   Prior to Admission medications   Medication Sig Start Date End Date Taking? Authorizing Provider  lamoTRIgine (LAMICTAL) 100 MG tablet Take 100 mg by mouth 2 (two) times daily.   Yes [provider]  lithium carbonate 300 MG capsule Take 300 mg by mouth 3 (three) times daily with meals.   Yes [provider]  drospirenone-ethinyl estradiol (YAZ,GIANVI,LORYNA) 3-0.02 MG tablet Take 1 tablet by mouth daily. Patient not taking: Reported on 10/04/2022    [provider]  fluticasone (FLONASE) 50 MCG/ACT nasal spray Place 2 sprays into both nostrils daily. Patient not taking: Reported on 10/04/2022 03/21/16   McVey, Madelaine Bhat, PA-C    Allergies  Allergen Reactions   Aleve [Naproxen Sodium]    Aspirin    Ciprofloxacin Hcl    Ibuprofen    Macrobid [Nitrofurantoin Monohyd Macro]     Chills, tingling, and nerve pain in legs    Penicillins    Sudafed [Pseudoephedrine Hcl]    Sulfa Antibiotics    Tramadol Nausea And Vomiting    Patient Active Problem List   Diagnosis Date Noted   Anxiety 05/18/2015   Bipolar I disorder (HCC) 10/10/2012    Past Medical History:  Diagnosis Date   Allergy    Anxiety    Bipolar 1 disorder (HCC)    Depression    Goiter     No past surgical history on file.  Social History   Socioeconomic History   Marital status: Single    Spouse name: Not on file   Number of children: Not on file   Years of education: Not on file   Highest  education level: Not on file  Occupational History   Not on file  Tobacco Use   Smoking status: Never   Smokeless tobacco: Never  Substance and Sexual Activity   Alcohol use: No    Alcohol/week: 0.0 standard drinks of alcohol   Drug use: No   Sexual activity: Not Currently  Other Topics Concern   Not on file  Social History Narrative   Not on file   Social Determinants of Health   Financial Resource Strain: Low Risk  (08/24/2021)   Overall Financial Resource Strain (CARDIA)    Difficulty of Paying Living Expenses: Not hard at all  Food Insecurity: No Food Insecurity (08/24/2021)   Hunger Vital Sign    Worried About Running Out of Food in the Last Year: Never true    Ran Out of Food in the Last Year: Never true  Transportation Needs: No Transportation Needs (08/24/2021)   PRAPARE - Administrator, Civil Service (Medical): No    Lack of Transportation (Non-Medical): No  Physical Activity: Sufficiently Active (08/24/2021)   Exercise Vital Sign    Days of Exercise per Week: 7 days    Minutes of Exercise per Session: 30 min  Stress: No Stress Concern Present (08/24/2021)   [employer] of Occupational  Health - Occupational Stress Questionnaire    Feeling of Stress : Not at all  Social Connections: Moderately Integrated (08/24/2021)   Social Connection and Isolation Panel [NHANES]    Frequency of Communication with Friends and Family: More than three times a week    Frequency of Social Gatherings with Friends and Family: Three times a week    Attends Religious Services: 1 to 4 times per year    Active Member of Clubs or Organizations: Yes    Attends Banker Meetings: More than 4 times per year    Marital Status: Never married  Intimate Partner Violence: Not At Risk (08/24/2021)   Humiliation, Afraid, Rape, and Kick questionnaire    Fear of Current or Ex-Partner: No    Emotionally Abused: No    Physically Abused: No    Sexually Abused: No    Family  History  Problem Relation Age of Onset   Hypertension Mother    Cancer Mother        melanoma   Cancer Father        prostate   Bipolar disorder Father    Cancer Maternal Grandmother    Cancer Maternal Grandfather    Hypertension Paternal Grandmother    Cancer Paternal Grandfather      Review of Systems  Constitutional: Negative.  Negative for chills and fever.  HENT: Negative.  Negative for congestion and sore throat.   Respiratory: Negative.  Negative for cough and shortness of breath.   Cardiovascular: Negative.  Negative for chest pain and palpitations.  Gastrointestinal:  Negative for abdominal pain, diarrhea, nausea and vomiting.  Genitourinary: Negative.  Negative for dysuria and hematuria.  Skin: Negative.  Negative for rash.  Neurological: Negative.  Negative for dizziness and headaches.  All other systems reviewed and are negative.   Vitals:   10/04/22 0929  BP: 110/72  Pulse: 79  Temp: 98.5 F (36.9 C)  SpO2: 98%    Physical Exam Vitals reviewed.  Constitutional:      Appearance: Normal appearance.  HENT:     Head: Normocephalic.     Right Ear: Tympanic membrane, ear canal and external ear normal.     Left Ear: Tympanic membrane, ear canal and external ear normal.     Mouth/Throat:     Mouth: Mucous membranes are moist.     Pharynx: Oropharynx is clear.  Eyes:     Extraocular Movements: Extraocular movements intact.     Conjunctiva/sclera: Conjunctivae normal.     Pupils: Pupils are equal, round, and reactive to light.  Cardiovascular:     Rate and Rhythm: Normal rate and regular rhythm.     Pulses: Normal pulses.     Heart sounds: Normal heart sounds.  Pulmonary:     Effort: Pulmonary effort is normal.     Breath sounds: Normal breath sounds.  Abdominal:     Palpations: Abdomen is soft.     Tenderness: There is no abdominal tenderness.  Musculoskeletal:     Cervical back: No tenderness.  Lymphadenopathy:     Cervical: No cervical adenopathy.   Skin:    General: Skin is warm and dry.     Capillary Refill: Capillary refill takes less than 2 seconds.  Neurological:     General: No focal deficit present.     Mental Status: She is alert and oriented to person, place, and time.  Psychiatric:        Mood and Affect: Mood normal.  Behavior: Behavior normal.      ASSESSMENT & PLAN: Problem List Items Addressed This Visit   None Visit Diagnoses     Routine general medical examination at a health care facility    -  Primary   Relevant Orders   CBC with Differential   Comprehensive metabolic panel   Hemoglobin A1c   Lipid panel   Hepatitis C antibody screen   Need for hepatitis C screening test       Relevant Orders   Hepatitis C antibody screen   Screening for deficiency anemia       Relevant Orders   CBC with Differential   Screening for lipoid disorders       Relevant Orders   Lipid panel   Screening for endocrine, metabolic and immunity disorder       Relevant Orders   Comprehensive metabolic panel   Hemoglobin A1c      Modifiable risk factors discussed with patient. Anticipatory guidance according to age provided. The following topics were also discussed: Social Determinants of Health Smoking.  Non-smoker Diet and nutrition.  Good eating habits Benefits of exercise.  Exercises regularly Cancer family history review Vaccinations review and recommendations Cardiovascular risk assessment Mental health including depression and anxiety Fall and accident prevention   Patient Instructions  Health Maintenance, Female Adopting a healthy lifestyle and getting preventive care are important in promoting health and wellness. Ask your health care provider about: The right schedule for you to have regular tests and exams. Things you can do on your own to prevent diseases and keep yourself healthy. What should I know about diet, weight, and exercise? Eat a healthy diet  Eat a diet that includes plenty of  vegetables, fruits, low-fat dairy products, and lean protein. Do not eat a lot of foods that are high in solid fats, added sugars, or sodium. Maintain a healthy weight Body mass index (BMI) is used to identify weight problems. It estimates body fat based on height and weight. Your health care provider can help determine your BMI and help you achieve or maintain a healthy weight. Get regular exercise Get regular exercise. This is one of the most important things you can do for your health. Most adults should: Exercise for at least 150 minutes each week. The exercise should increase your heart rate and make you sweat (moderate-intensity exercise). Do strengthening exercises at least twice a week. This is in addition to the moderate-intensity exercise. Spend less time sitting. Even light physical activity can be beneficial. Watch cholesterol and blood lipids Have your blood tested for lipids and cholesterol at 37 years of age, then have this test every 5 years. Have your cholesterol levels checked more often if: Your lipid or cholesterol levels are high. You are older than 37 years of age. You are at high risk for heart disease. What should I know about cancer screening? Depending on your health history and family history, you may need to have cancer screening at various ages. This may include screening for: Breast cancer. Cervical cancer. Colorectal cancer. Skin cancer. Lung cancer. What should I know about heart disease, diabetes, and high blood pressure? Blood pressure and heart disease High blood pressure causes heart disease and increases the risk of stroke. This is more likely to develop in people who have high blood pressure readings or are overweight. Have your blood pressure checked: Every 3-5 years if you are 70-27 years of age. Every year if you are 31 years old or older. Diabetes Have  regular diabetes screenings. This checks your fasting blood sugar level. Have the screening  done: Once every three years after age 75 if you are at a normal weight and have a low risk for diabetes. More often and at a younger age if you are overweight or have a high risk for diabetes. What should I know about preventing infection? Hepatitis B If you have a higher risk for hepatitis B, you should be screened for this virus. Talk with your health care provider to find out if you are at risk for hepatitis B infection. Hepatitis C Testing is recommended for: Everyone born from [birth year] through 1965. Anyone with known risk factors for hepatitis C. Sexually transmitted infections (STIs) Get screened for STIs, including gonorrhea and chlamydia, if: You are sexually active and are younger than 37 years of age. You are older than 37 years of age and your health care provider tells you that you are at risk for this type of infection. Your sexual activity has changed since you were last screened, and you are at increased risk for chlamydia or gonorrhea. Ask your health care provider if you are at risk. Ask your health care provider about whether you are at high risk for HIV. Your health care provider may recommend a prescription medicine to help prevent HIV infection. If you choose to take medicine to prevent HIV, you should first get tested for HIV. You should then be tested every 3 months for as long as you are taking the medicine. Pregnancy If you are about to stop having your period (premenopausal) and you may become pregnant, seek counseling before you get pregnant. Take 400 to 800 micrograms (mcg) of folic acid every day if you become pregnant. Ask for birth control (contraception) if you want to prevent pregnancy. Osteoporosis and menopause Osteoporosis is a disease in which the bones lose minerals and strength with aging. This can result in bone fractures. If you are 94 years old or older, or if you are at risk for osteoporosis and fractures, ask your health care provider if you should: Be  screened for bone loss. Take a calcium or vitamin D supplement to lower your risk of fractures. Be given hormone replacement therapy (HRT) to treat symptoms of menopause. Follow these instructions at home: Alcohol use Do not drink alcohol if: Your health care provider tells you not to drink. You are pregnant, may be pregnant, or are planning to become pregnant. If you drink alcohol: Limit how much you have to: 0-1 drink a day. Know how much alcohol is in your drink. In the [place] one drink equals one 12 oz bottle of beer (355 mL), one 5 oz glass of wine (148 mL), or one 1 oz glass of hard liquor (44 mL). Lifestyle Do not use any products that contain nicotine or tobacco. These products include cigarettes, chewing tobacco, and vaping devices, such as e-cigarettes. If you need help quitting, ask your health care provider. Do not use street drugs. Do not share needles. Ask your health care provider for help if you need support or information about quitting drugs. General instructions Schedule regular health, dental, and eye exams. Stay current with your vaccines. Tell your health care provider if: You often feel depressed. You have ever been abused or do not feel safe at home. Summary Adopting a healthy lifestyle and getting preventive care are important in promoting health and wellness. Follow your health care provider's instructions about healthy diet, exercising, and getting tested or screened for diseases. Follow  your health care provider's instructions on monitoring your cholesterol and blood pressure. This information is not intended to replace advice given to you by your health care provider. Make sure you discuss any questions you have with your health care provider. Document Revised: 07/05/2020 Document Reviewed: 07/05/2020 Elsevier Patient Education  2024 Elsevier Inc.      Edwina Barth, MD [facility]

## 2022-10-09 DIAGNOSIS — F411 Generalized anxiety disorder: Secondary | ICD-10-CM | POA: Diagnosis not present

## 2022-10-09 DIAGNOSIS — F319 Bipolar disorder, unspecified: Secondary | ICD-10-CM | POA: Diagnosis not present

## 2022-10-09 DIAGNOSIS — F4010 Social phobia, unspecified: Secondary | ICD-10-CM | POA: Diagnosis not present

## 2022-12-18 DIAGNOSIS — H5213 Myopia, bilateral: Secondary | ICD-10-CM | POA: Diagnosis not present

## 2022-12-19 DIAGNOSIS — Z319 Encounter for procreative management, unspecified: Secondary | ICD-10-CM | POA: Diagnosis not present

## 2022-12-19 DIAGNOSIS — Z01419 Encounter for gynecological examination (general) (routine) without abnormal findings: Secondary | ICD-10-CM | POA: Diagnosis not present

## 2022-12-19 DIAGNOSIS — Z01411 Encounter for gynecological examination (general) (routine) with abnormal findings: Secondary | ICD-10-CM | POA: Diagnosis not present

## 2022-12-19 DIAGNOSIS — Z1331 Encounter for screening for depression: Secondary | ICD-10-CM | POA: Diagnosis not present

## 2022-12-19 DIAGNOSIS — Z124 Encounter for screening for malignant neoplasm of cervix: Secondary | ICD-10-CM | POA: Diagnosis not present

## 2022-12-19 DIAGNOSIS — L292 Pruritus vulvae: Secondary | ICD-10-CM | POA: Diagnosis not present

## 2022-12-19 DIAGNOSIS — N898 Other specified noninflammatory disorders of vagina: Secondary | ICD-10-CM | POA: Diagnosis not present

## 2022-12-19 DIAGNOSIS — Z113 Encounter for screening for infections with a predominantly sexual mode of transmission: Secondary | ICD-10-CM | POA: Diagnosis not present

## 2022-12-19 DIAGNOSIS — R6882 Decreased libido: Secondary | ICD-10-CM | POA: Diagnosis not present

## 2023-01-02 DIAGNOSIS — R6882 Decreased libido: Secondary | ICD-10-CM | POA: Diagnosis not present

## 2023-01-02 DIAGNOSIS — R87810 Cervical high risk human papillomavirus (HPV) DNA test positive: Secondary | ICD-10-CM | POA: Diagnosis not present

## 2023-01-14 IMAGING — US US THYROID
1 series · 14 of 25 positions shown · non-contrast
Comparison: 07/22/2010

CLINICAL DATA: Palpable abnormality. 35-year-old female with
thyromegaly on physical examination.

EXAM:
THYROID ULTRASOUND
TECHNIQUE: Ultrasound examination of the thyroid gland and adjacent soft
tissues was performed.

[Series 1: us thyroid · 0.06mm/px · 14 of 53 slices shown]
[im 1/53]
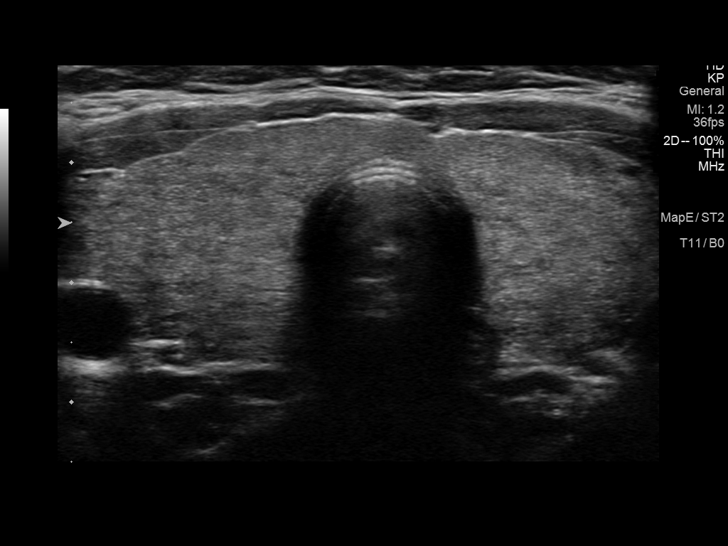
[im 5/53]
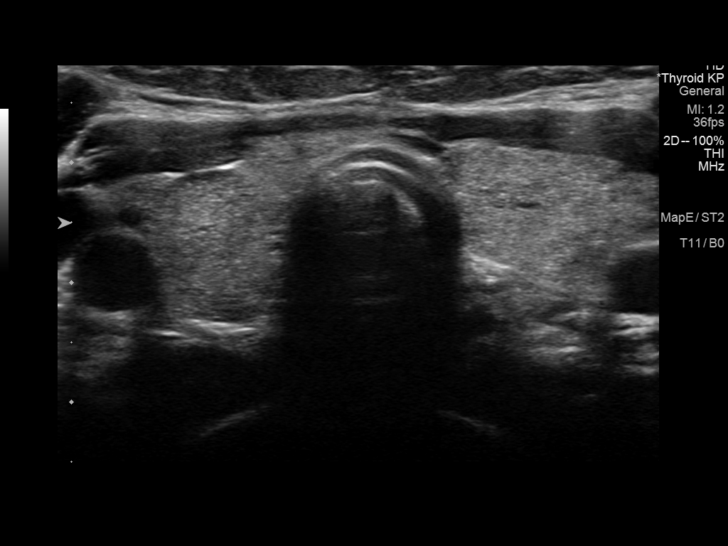
[im 9/53]
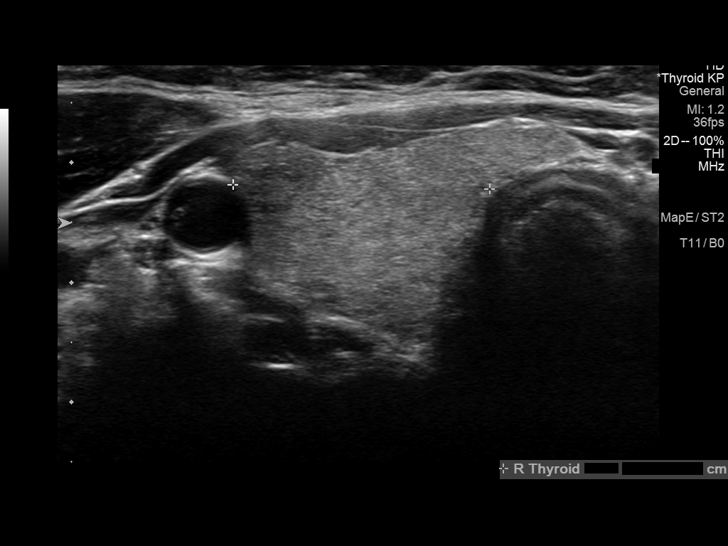
[im 14/53]
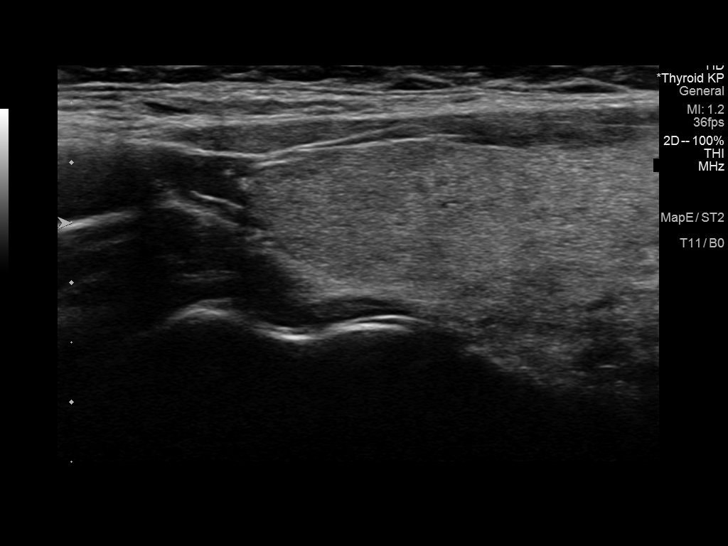
[im 18/53]
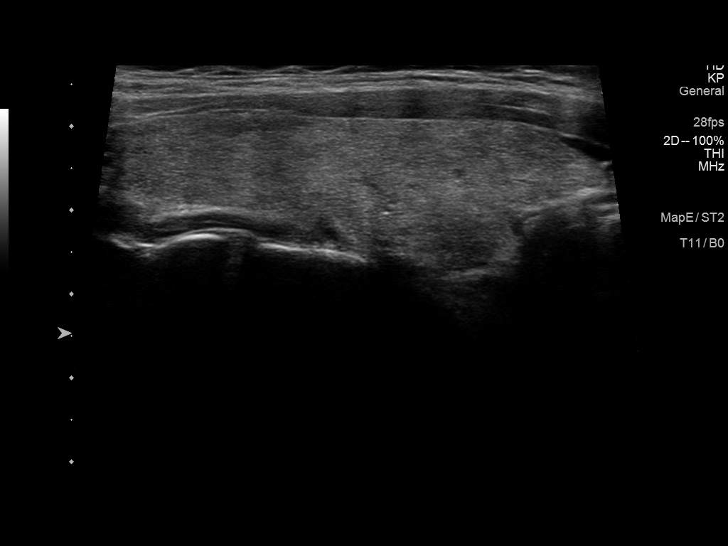
[im 20/53]
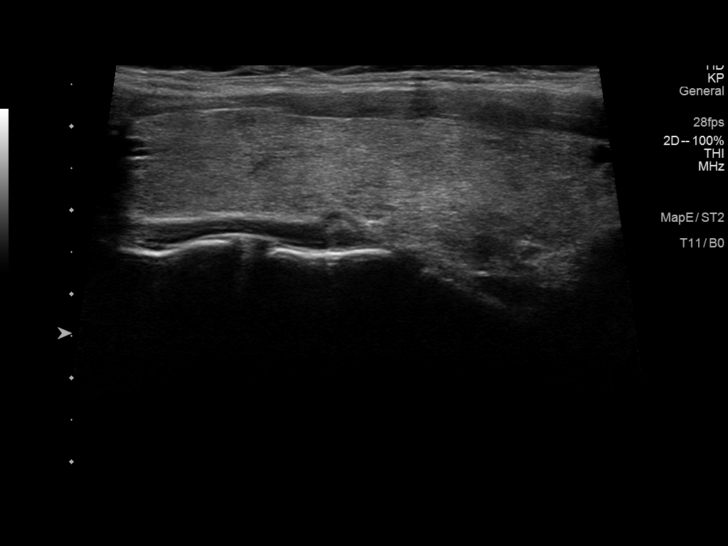
[im 24/53]
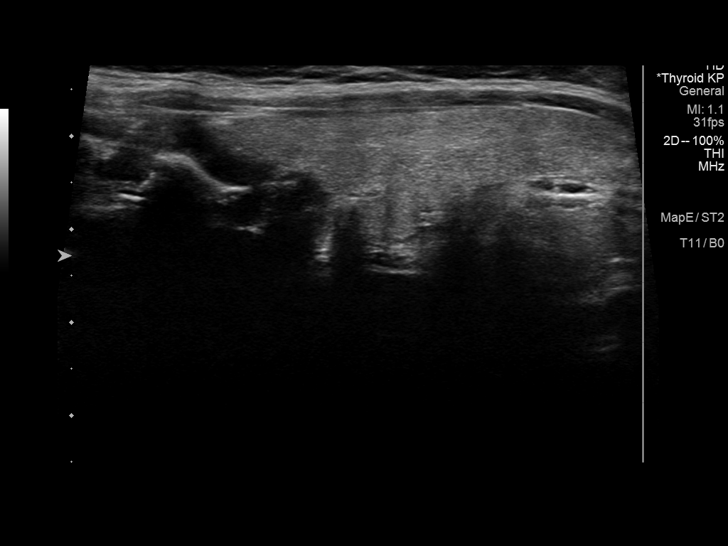
[im 29/53]
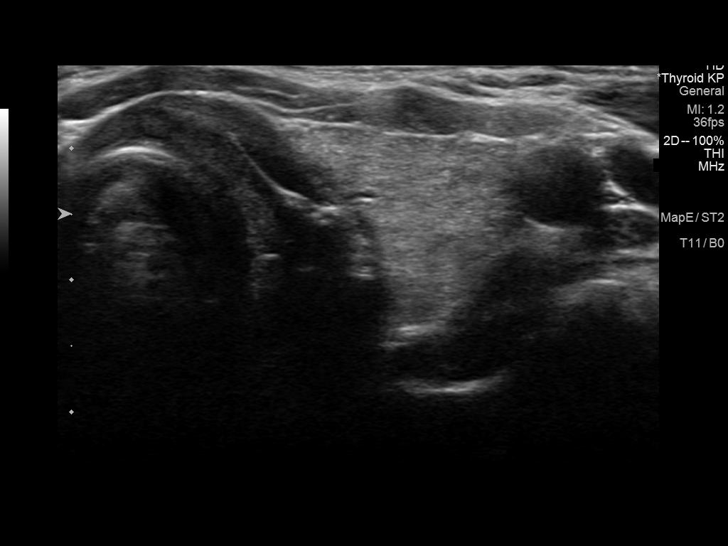
[im 33/53]
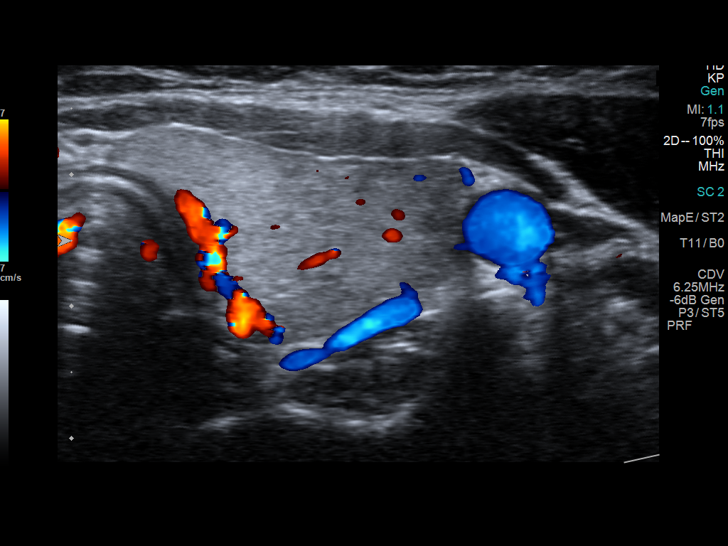
[im 35/53]
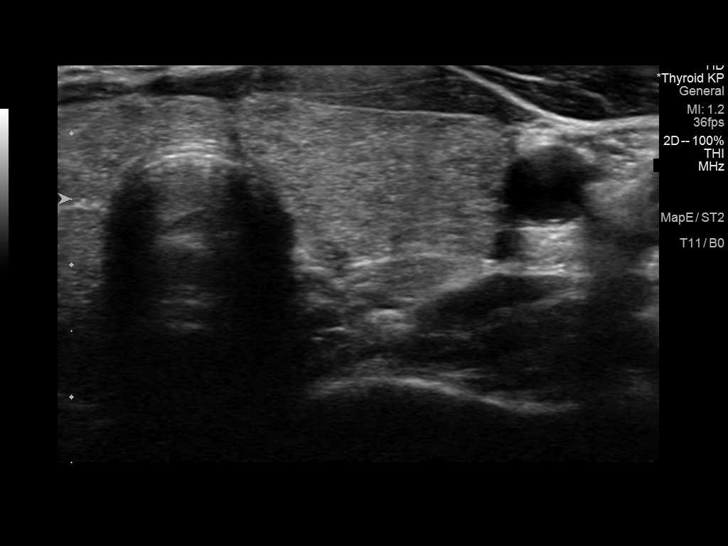
[im 40/53]
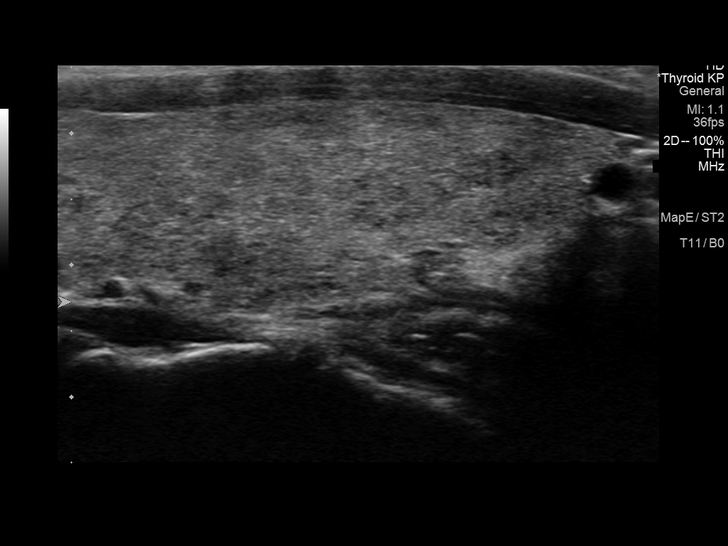
[im 44/53]
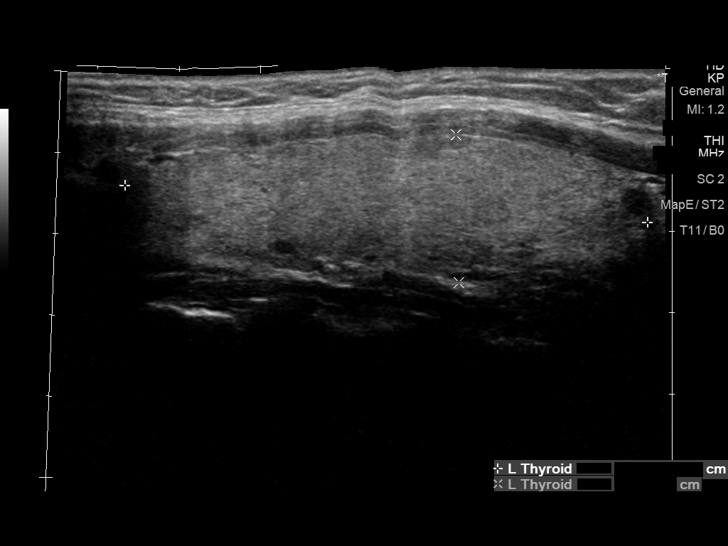
[im 48/53]
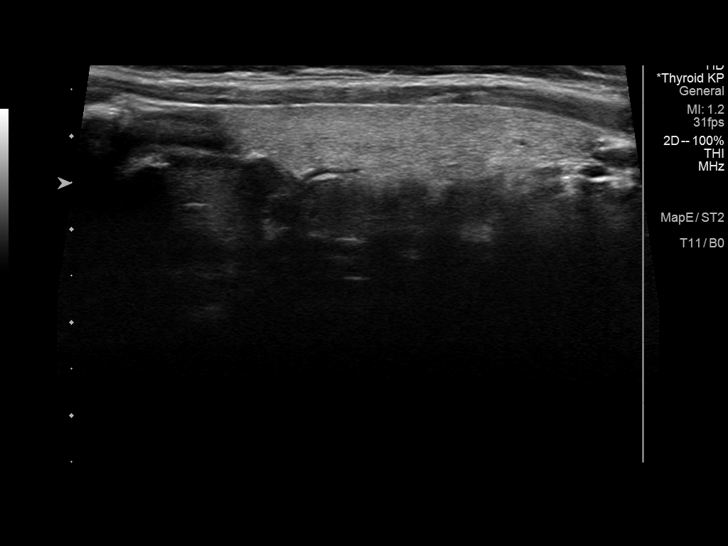
[im 53/53]
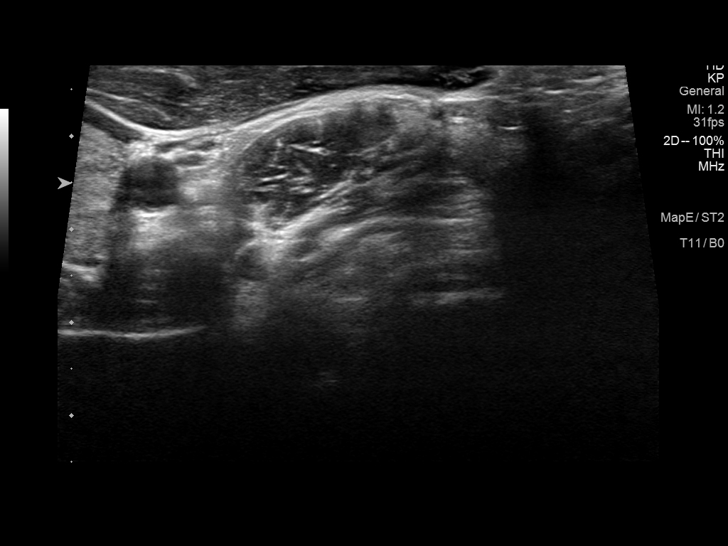

[14 of 25 positions shown; findings below may reference images not displayed]

FINDINGS: Parenchymal Echotexture: Mildly heterogenous

Isthmus: 0.5 cm, previously 0.4 cm

Right lobe: 6.0 x 1.8 x 2.1 cm, previous 6.6 x 2.1 x 2.5 cm

Left lobe: 6.4 x 1.8 x 2.1 cm, previously 6.6 x 1.8 x 1.8 cm

_________________________________________________________

Estimated total number of nodules >/= 1 cm: 0

Number of spongiform nodules >/=  2 cm not described below (TR1): 0

Number of mixed cystic and solid nodules >/= 1.5 cm not described
below (TR2): 0

_________________________________________________________

No discrete nodules are seen within the thyroid gland. No cervical
lymphadenopathy.
IMPRESSION: Similar appearing mildly heterogeneous, mildly enlarged thyroid
gland without discrete nodule. These findings are nonspecific.

## 2023-01-17 DIAGNOSIS — F4010 Social phobia, unspecified: Secondary | ICD-10-CM | POA: Diagnosis not present

## 2023-01-17 DIAGNOSIS — F411 Generalized anxiety disorder: Secondary | ICD-10-CM | POA: Diagnosis not present

## 2023-01-17 DIAGNOSIS — F319 Bipolar disorder, unspecified: Secondary | ICD-10-CM | POA: Diagnosis not present

## 2023-01-19 DIAGNOSIS — L292 Pruritus vulvae: Secondary | ICD-10-CM | POA: Diagnosis not present

## 2023-03-26 DIAGNOSIS — D225 Melanocytic nevi of trunk: Secondary | ICD-10-CM | POA: Diagnosis not present

## 2023-03-26 DIAGNOSIS — L821 Other seborrheic keratosis: Secondary | ICD-10-CM | POA: Diagnosis not present

## 2023-05-04 DIAGNOSIS — R399 Unspecified symptoms and signs involving the genitourinary system: Secondary | ICD-10-CM | POA: Diagnosis not present

## 2023-05-04 DIAGNOSIS — R300 Dysuria: Secondary | ICD-10-CM | POA: Diagnosis not present

## 2023-05-05 DIAGNOSIS — N898 Other specified noninflammatory disorders of vagina: Secondary | ICD-10-CM | POA: Diagnosis not present

## 2023-05-05 DIAGNOSIS — Z708 Other sex counseling: Secondary | ICD-10-CM | POA: Diagnosis not present

## 2023-05-05 DIAGNOSIS — N760 Acute vaginitis: Secondary | ICD-10-CM | POA: Diagnosis not present

## 2023-05-05 DIAGNOSIS — Z113 Encounter for screening for infections with a predominantly sexual mode of transmission: Secondary | ICD-10-CM | POA: Diagnosis not present

## 2023-05-22 DIAGNOSIS — R300 Dysuria: Secondary | ICD-10-CM | POA: Diagnosis not present

## 2023-05-31 DIAGNOSIS — N907 Vulvar cyst: Secondary | ICD-10-CM | POA: Diagnosis not present

## 2023-07-30 DIAGNOSIS — L821 Other seborrheic keratosis: Secondary | ICD-10-CM | POA: Diagnosis not present

## 2023-07-30 DIAGNOSIS — D2272 Melanocytic nevi of left lower limb, including hip: Secondary | ICD-10-CM | POA: Diagnosis not present

## 2023-07-30 DIAGNOSIS — D2262 Melanocytic nevi of left upper limb, including shoulder: Secondary | ICD-10-CM | POA: Diagnosis not present

## 2023-07-30 DIAGNOSIS — D2261 Melanocytic nevi of right upper limb, including shoulder: Secondary | ICD-10-CM | POA: Diagnosis not present

## 2023-10-01 DIAGNOSIS — R1084 Generalized abdominal pain: Secondary | ICD-10-CM | POA: Diagnosis not present

## 2023-10-01 DIAGNOSIS — Z881 Allergy status to other antibiotic agents status: Secondary | ICD-10-CM | POA: Diagnosis not present

## 2023-10-01 DIAGNOSIS — Z880 Allergy status to penicillin: Secondary | ICD-10-CM | POA: Diagnosis not present

## 2023-10-01 DIAGNOSIS — R109 Unspecified abdominal pain: Secondary | ICD-10-CM | POA: Diagnosis not present

## 2023-10-01 DIAGNOSIS — Z885 Allergy status to narcotic agent status: Secondary | ICD-10-CM | POA: Diagnosis not present

## 2023-10-01 DIAGNOSIS — Z87442 Personal history of urinary calculi: Secondary | ICD-10-CM | POA: Diagnosis not present

## 2023-10-01 DIAGNOSIS — R300 Dysuria: Secondary | ICD-10-CM | POA: Diagnosis not present

## 2023-10-01 DIAGNOSIS — Z882 Allergy status to sulfonamides status: Secondary | ICD-10-CM | POA: Diagnosis not present

## 2023-10-01 DIAGNOSIS — N12 Tubulo-interstitial nephritis, not specified as acute or chronic: Secondary | ICD-10-CM | POA: Diagnosis not present

## 2023-10-04 ENCOUNTER — Ambulatory Visit (INDEPENDENT_AMBULATORY_CARE_PROVIDER_SITE_OTHER)

## 2023-10-04 VITALS — Ht 69.0 in | Wt 144.0 lb

## 2023-10-04 DIAGNOSIS — Z0000 Encounter for general adult medical examination without abnormal findings: Secondary | ICD-10-CM

## 2023-10-04 DIAGNOSIS — Z124 Encounter for screening for malignant neoplasm of cervix: Secondary | ICD-10-CM

## 2023-10-04 NOTE — Patient Instructions (Addendum)
 Amber Schultz , Thank you for taking time out of your busy schedule to complete your Annual Wellness Visit with me. I enjoyed our conversation and look forward to speaking with you again next year. I, as well as your care team,  appreciate your ongoing commitment to your health goals. Please review the following plan we discussed and let me know if I can assist you in the future. Your Game plan/ To Do List    Referrals: If you haven't heard from the office you've been referred to, please reach out to them at the phone provided. Referral to Dr Barbette for a pap smear.  Follow up Visits: We will see or speak with you next year for your Next Medicare AWV with our clinical staff Have you seen your provider in the last 6 months (3 months if uncontrolled diabetes)? No  Clinician Recommendations:  Aim for 30 minutes of exercise or brisk walking, 6-8 glasses of water, and 5 servings of fruits and vegetables each day. Educated and advised on getting the Hepatitis B and HPV vaccines in 2025.      This is a list of the screenings recommended for you:  Health Maintenance  Topic Date Due   Hepatitis B Vaccine (1 of 3 - 19+ 3-dose series) Never done   HPV Vaccine (1 - 3-dose SCDM series) Never done   Pap with HPV screening  Never done   COVID-19 Vaccine (3 - 2024-25 season) 10/29/2022   Flu Shot  09/28/2023   Medicare Annual Wellness Visit  10/03/2024   DTaP/Tdap/Td vaccine (2 - Td or Tdap) 04/04/2027   Hepatitis C Screening  Completed   HIV Screening  Completed   Meningitis B Vaccine  Aged Out    Advanced directives: (Declined) Advance directive discussed with you today. Even though you declined this today, please call our office should you change your mind, and we can give you the proper paperwork for you to fill out. Advance Care Planning is important because it:  [x]  Makes sure you receive the medical care that is consistent with your values, goals, and preferences  [x]  It provides guidance to your  family and loved ones and reduces their decisional burden about whether or not they are making the right decisions based on your wishes.  Follow the link provided in your after visit summary or read over the paperwork we have mailed to you to help you started getting your Advance Directives in place. If you need assistance in completing these, please reach out to us  so that we can help you!

## 2023-10-04 NOTE — Progress Notes (Signed)
 Subjective:   Amber Schultz is a 38 y.o. who presents for a Medicare Wellness preventive visit.  As a reminder, Annual Wellness Visits don't include a physical exam, and some assessments may be limited, especially if this visit is performed virtually. We may recommend an in-person follow-up visit with your provider if needed.  Visit Complete: Virtual I connected with  Delon JAYSON Peach on 10/04/23 by a audio enabled telemedicine application and verified that I am speaking with the correct person using two identifiers.  Patient Location: Home  Provider Location: Office/Clinic  I discussed the limitations of evaluation and management by telemedicine. The patient expressed understanding and agreed to proceed.  Vital Signs: Because this visit was a virtual/telehealth visit, some criteria may be missing or patient reported. Any vitals not documented were not able to be obtained and vitals that have been documented are patient reported.  VideoDeclined- This patient declined Librarian, academic. Therefore the visit was completed with audio only.  Persons Participating in Visit: Patient.  AWV Questionnaire: Yes: Patient Medicare AWV questionnaire was completed by the patient on 10/04/2023 -partial; I have confirmed that all information answered by patient is correct and no changes since this date.  Cardiac Risk Factors include: advanced age (>68men, >6 women)     Objective:    Today's Vitals   10/04/23 1231  Weight: 144 lb (65.3 kg)  Height: 5' 9 (1.753 m)   Body mass index is 21.27 kg/m.     10/04/2023   12:31 PM 08/01/2022    1:10 PM 06/05/2020   12:27 AM  Advanced Directives  Does Patient Have a Medical Advance Directive? No No No  Would patient like information on creating a medical advance directive? No - Patient declined No - Patient declined No - Patient declined    Current Medications (verified) Outpatient Encounter Medications as of  10/04/2023  Medication Sig   lamoTRIgine (LAMICTAL) 100 MG tablet Take 100 mg by mouth 2 (two) times daily.   fluticasone  (FLONASE ) 50 MCG/ACT nasal spray Place 2 sprays into both nostrils daily. (Patient not taking: Reported on 10/04/2022)   [DISCONTINUED] drospirenone-ethinyl estradiol  (YAZ,GIANVI,LORYNA) 3-0.02 MG tablet Take 1 tablet by mouth daily. (Patient not taking: Reported on 10/04/2022)   [DISCONTINUED] lithium  carbonate 300 MG capsule Take 300 mg by mouth 3 (three) times daily with meals.   No facility-administered encounter medications on file as of 10/04/2023.    Allergies (verified) Aleve [naproxen sodium], Aspirin, Ciprofloxacin  hcl, Ibuprofen, Macrobid [nitrofurantoin monohyd macro], Penicillins, Sudafed [pseudoephedrine hcl], Sulfa antibiotics, and Tramadol   History: Past Medical History:  Diagnosis Date   Allergy    Anxiety    Bipolar 1 disorder (HCC)    Depression    Goiter    History reviewed. No pertinent surgical history. Family History  Problem Relation Age of Onset   Hypertension Mother    Cancer Mother        melanoma   Cancer Father        prostate   Bipolar disorder Father    Cancer Maternal Grandmother    Cancer Maternal Grandfather    Hypertension Paternal Grandmother    Cancer Paternal Grandfather    Social History   Socioeconomic History   Marital status: Single    Spouse name: Not on file   Number of children: Not on file   Years of education: Not on file   Highest education level: Master's degree (e.g., MA, MS, MEng, MEd, MSW, MBA)  Occupational History  Not on file  Tobacco Use   Smoking status: Never   Smokeless tobacco: Never  Substance and Sexual Activity   Alcohol use: Yes    Alcohol/week: 1.0 standard drink of alcohol    Types: 1 Glasses of wine per week   Drug use: No   Sexual activity: Not Currently  Other Topics Concern   Not on file  Social History Narrative   Not on file   Social Drivers of Health   Financial  Resource Strain: Low Risk  (10/04/2023)   Overall Financial Resource Strain (CARDIA)    Difficulty of Paying Living Expenses: Not very hard  Food Insecurity: No Food Insecurity (10/04/2023)   Hunger Vital Sign    Worried About Running Out of Food in the Last Year: Never true    Ran Out of Food in the Last Year: Never true  Transportation Needs: No Transportation Needs (10/04/2023)   PRAPARE - Administrator, Civil Service (Medical): No    Lack of Transportation (Non-Medical): No  Physical Activity: Sufficiently Active (10/04/2023)   Exercise Vital Sign    Days of Exercise per Week: 5 days    Minutes of Exercise per Session: 60 min  Stress: Stress Concern Present (10/04/2023)   [employer] of Occupational Health - Occupational Stress Questionnaire    Feeling of Stress: Very much  Social Connections: Moderately Integrated (10/04/2023)   Social Connection and Isolation Panel    Frequency of Communication with Friends and Family: More than three times a week    Frequency of Social Gatherings with Friends and Family: More than three times a week    Attends Religious Services: More than 4 times per year    Active Member of [employer] or Organizations: Yes    Attends Banker Meetings: 1 to 4 times per year    Marital Status: Never married    Tobacco Counseling Counseling given: No    Clinical Intake:  Pre-visit preparation completed: Yes  Pain : No/denies pain     BMI - recorded: 21.27 Nutritional Status: BMI of 19-24  Normal Nutritional Risks: None Diabetes: No  Lab Results  Component Value Date   HGBA1C 5.3 10/04/2022   HGBA1C 5.3 01/06/2021     How often do you need to have someone help you when you read instructions, pamphlets, or other written materials from your doctor or pharmacy?: 1 - Never  Interpreter Needed?: No  Information entered by :: Verdie Saba, CMA   Activities of Daily Living     10/04/2023   12:34 PM  In your present state of  health, do you have any difficulty performing the following activities:  Hearing? 0  Vision? 0  Difficulty concentrating or making decisions? 0  Walking or climbing stairs? 0  Dressing or bathing? 0  Doing errands, shopping? 0  Preparing Food and eating ? N  Using the Toilet? N  In the past six months, have you accidently leaked urine? N  Do you have problems with loss of bowel control? N  Managing your Medications? N  Managing your Finances? N  Housekeeping or managing your Housekeeping? N    Patient Care Team: Purcell Emil Schanz, MD as PCP - General (Internal Medicine) Charmayne Molly, MD as Consulting Physician (Ophthalmology) Barbette Knock, MD as Consulting Physician (Obstetrics and Gynecology)  I have updated your Care Teams any recent Medical Services you may have received from other providers in the past year.     Assessment:   This is  a routine wellness examination for [place].  Hearing/Vision screen Hearing Screening - Comments:: Denies hearing difficulties   Vision Screening - Comments:: Wears contact lenses - up-to date w/Dr Arlyss King   Goals Addressed               This Visit's Progress     Patient Stated (pt-stated)        Patient stated she plans to maintain her health       Depression Screen     10/04/2023   12:35 PM 10/04/2022    9:30 AM 08/24/2021    1:35 PM 01/06/2021   10:25 AM 06/08/2020   10:31 AM 05/03/2018    3:17 PM 01/01/2018    1:47 PM  PHQ 2/9 Scores  PHQ - 2 Score 0 4 0 0 0 0 0  PHQ- 9 Score 0 10 0        Fall Risk     10/04/2023   12:34 PM 10/04/2022    9:30 AM 08/24/2021    1:37 PM 08/24/2021   12:41 PM 01/06/2021   10:24 AM  Fall Risk   Falls in the past year? 0 0 0 0 0  Number falls in past yr: 0 0 0 0 0  Injury with Fall? 0 0 0 0 0  Risk for fall due to : No Fall Risks No Fall Risks No Fall Risks    Follow up Falls evaluation completed;Falls prevention discussed Falls evaluation completed       MEDICARE RISK AT HOME:   Medicare Risk at Home Any stairs in or around the home?: Yes If so, are there any without handrails?: No Home free of loose throw rugs in walkways, pet beds, electrical cords, etc?: Yes Adequate lighting in your home to reduce risk of falls?: Yes Life alert?: No Use of a cane, walker or w/c?: No Grab bars in the bathroom?: No Shower chair or bench in shower?: No Elevated toilet seat or a handicapped toilet?: No  TIMED UP AND GO:  Was the test performed?  No  Cognitive Function: 6CIT completed        10/04/2023   12:42 PM 08/24/2021    1:37 PM  6CIT Screen  What Year? 0 points 0 points  What month? 0 points 0 points  What time? 0 points 0 points  Count back from 20 0 points 0 points  Months in reverse 0 points 0 points  Repeat phrase 0 points 0 points  Total Score 0 points 0 points    Immunizations Immunization History  Administered Date(s) Administered   Influenza Inj Mdck Quad With Preservative 01/30/2018   PFIZER(Purple Top)SARS-COV-2 Vaccination 06/07/2019, 07/01/2019   Tdap 04/03/2017    Screening Tests Health Maintenance  Topic Date Due   Hepatitis B Vaccines (1 of 3 - 19+ 3-dose series) Never done   HPV VACCINES (1 - 3-dose SCDM series) Never done   Cervical Cancer Screening (HPV/Pap Cotest)  Never done   COVID-19 Vaccine (3 - 2024-25 season) 10/29/2022   INFLUENZA VACCINE  09/28/2023   Medicare Annual Wellness (AWV)  10/03/2024   DTaP/Tdap/Td (2 - Td or Tdap) 04/04/2027   Hepatitis C Screening  Completed   HIV Screening  Completed   Meningococcal B Vaccine  Aged Out    Health Maintenance  Health Maintenance Due  Topic Date Due   Hepatitis B Vaccines (1 of 3 - 19+ 3-dose series) Never done   HPV VACCINES (1 - 3-dose SCDM series) Never done  Cervical Cancer Screening (HPV/Pap Cotest)  Never done   COVID-19 Vaccine (3 - 2024-25 season) 10/29/2022   INFLUENZA VACCINE  09/28/2023   Health Maintenance Items Addressed:  Referral to Dr Barbette for an  annual pap smear.  Additional Screening:  Vision Screening: Recommended annual ophthalmology exams for early detection of glaucoma and other disorders of the eye. Would you like a referral to an eye doctor? No    Dental Screening: Recommended annual dental exams for proper oral hygiene  Community Resource Referral / Chronic Care Management: CRR required this visit?  No   CCM required this visit?  No   Plan:    I have personally reviewed and noted the following in the patient's chart:   Medical and social history Use of alcohol, tobacco or illicit drugs  Current medications and supplements including opioid prescriptions. Patient is not currently taking opioid prescriptions. Functional ability and status Nutritional status Physical activity Advanced directives List of other physicians Hospitalizations, surgeries, and ER visits in previous 12 months Vitals Screenings to include cognitive, depression, and falls Referrals and appointments  In addition, I have reviewed and discussed with patient certain preventive protocols, quality metrics, and best practice recommendations. A written personalized care plan for preventive services as well as general preventive health recommendations were provided to patient.   Verdie CHRISTELLA Saba, CMA   10/04/2023   After Visit Summary: (MyChart) Due to this being a telephonic visit, the after visit summary with patients personalized plan was offered to patient via MyChart   Notes: Nothing significant to report at this time.

## 2023-10-08 ENCOUNTER — Ambulatory Visit: Payer: Medicare Other | Admitting: Emergency Medicine

## 2023-10-08 ENCOUNTER — Ambulatory Visit: Payer: Self-pay | Admitting: Emergency Medicine

## 2023-10-08 ENCOUNTER — Encounter: Payer: Self-pay | Admitting: Emergency Medicine

## 2023-10-08 VITALS — BP 112/80 | HR 81 | Temp 98.1°F | Ht 69.0 in | Wt 147.0 lb

## 2023-10-08 DIAGNOSIS — Z13228 Encounter for screening for other metabolic disorders: Secondary | ICD-10-CM | POA: Diagnosis not present

## 2023-10-08 DIAGNOSIS — Z13220 Encounter for screening for lipoid disorders: Secondary | ICD-10-CM | POA: Diagnosis not present

## 2023-10-08 DIAGNOSIS — Z130 Encounter for screening for diseases of the blood and blood-forming organs and certain disorders involving the immune mechanism: Secondary | ICD-10-CM

## 2023-10-08 DIAGNOSIS — Z0000 Encounter for general adult medical examination without abnormal findings: Secondary | ICD-10-CM | POA: Diagnosis not present

## 2023-10-08 DIAGNOSIS — Z87440 Personal history of urinary (tract) infections: Secondary | ICD-10-CM | POA: Diagnosis not present

## 2023-10-08 DIAGNOSIS — Z1329 Encounter for screening for other suspected endocrine disorder: Secondary | ICD-10-CM | POA: Diagnosis not present

## 2023-10-08 LAB — URINALYSIS
Bilirubin Urine: NEGATIVE
Hgb urine dipstick: NEGATIVE
Ketones, ur: NEGATIVE
Leukocytes,Ua: NEGATIVE
Nitrite: NEGATIVE
Specific Gravity, Urine: 1.01 (ref 1.000–1.030)
Total Protein, Urine: NEGATIVE
Urine Glucose: NEGATIVE
Urobilinogen, UA: 0.2 (ref 0.0–1.0)
pH: 7 (ref 5.0–8.0)

## 2023-10-08 LAB — COMPREHENSIVE METABOLIC PANEL WITH GFR
ALT: 14 U/L (ref 0–35)
AST: 19 U/L (ref 0–37)
Albumin: 4.2 g/dL (ref 3.5–5.2)
Alkaline Phosphatase: 61 U/L (ref 39–117)
BUN: 17 mg/dL (ref 6–23)
CO2: 29 meq/L (ref 19–32)
Calcium: 8.9 mg/dL (ref 8.4–10.5)
Chloride: 105 meq/L (ref 96–112)
Creatinine, Ser: 0.67 mg/dL (ref 0.40–1.20)
GFR: 110.87 mL/min (ref >60.00)
Glucose, Bld: 84 mg/dL (ref 70–99)
Potassium: 4 meq/L (ref 3.5–5.1)
Sodium: 139 meq/L (ref 135–145)
Total Bilirubin: 0.5 mg/dL (ref 0.2–1.2)
Total Protein: 6.3 g/dL (ref 6.0–8.3)

## 2023-10-08 LAB — CBC WITH DIFFERENTIAL/PLATELET
Basophils Absolute: 0 K/uL (ref 0.0–0.1)
Basophils Relative: 0.7 % (ref 0.0–3.0)
Eosinophils Absolute: 0.1 K/uL (ref 0.0–0.7)
Eosinophils Relative: 2.1 % (ref 0.0–5.0)
HCT: 39.7 % (ref 36.0–46.0)
Hemoglobin: 13 g/dL (ref 12.0–15.0)
Lymphocytes Relative: 24.6 % (ref 12.0–46.0)
Lymphs Abs: 1.1 K/uL (ref 0.7–4.0)
MCHC: 32.7 g/dL (ref 30.0–36.0)
MCV: 88.9 fl (ref 78.0–100.0)
Monocytes Absolute: 0.4 K/uL (ref 0.1–1.0)
Monocytes Relative: 9.9 % (ref 3.0–12.0)
Neutro Abs: 2.8 K/uL (ref 1.4–7.7)
Neutrophils Relative %: 62.7 % (ref 43.0–77.0)
Platelets: 149 K/uL — ABNORMAL LOW (ref 150.0–400.0)
RBC: 4.46 Mil/uL (ref 3.87–5.11)
RDW: 13.1 % (ref 11.5–15.5)
WBC: 4.5 K/uL (ref 4.0–10.5)

## 2023-10-08 LAB — LIPID PANEL
Cholesterol: 157 mg/dL (ref 0–200)
HDL: 59.7 mg/dL (ref >39.00)
LDL Cholesterol: 80 mg/dL (ref 0–99)
NonHDL: 97.26
Total CHOL/HDL Ratio: 3
Triglycerides: 87 mg/dL (ref 0.0–149.0)
VLDL: 17.4 mg/dL (ref 0.0–40.0)

## 2023-10-08 LAB — VITAMIN B12: Vitamin B-12: 334 pg/mL (ref 211–911)

## 2023-10-08 LAB — FOLATE: Folate: 10.6 ng/mL (ref >5.9)

## 2023-10-08 LAB — VITAMIN D 25 HYDROXY (VIT D DEFICIENCY, FRACTURES): VITD: 20.02 ng/mL — ABNORMAL LOW (ref 30.00–100.00)

## 2023-10-08 LAB — MAGNESIUM: Magnesium: 1.8 mg/dL (ref 1.5–2.5)

## 2023-10-08 LAB — HEMOGLOBIN A1C: Hgb A1c MFr Bld: 5.5 % (ref 4.6–6.5)

## 2023-10-08 NOTE — Patient Instructions (Signed)

## 2023-10-08 NOTE — Progress Notes (Signed)
 Amber Schultz 38 y.o.   Chief Complaint  Patient presents with   Annual Exam    Patient here for physical. Patient mentions she was in the ER last Monday. She states she was peeing blood and they told her she had a lot of WBC. She wanted to follow up on that.     HISTORY OF PRESENT ILLNESS: This is a 38 y.o. female here for annual exam. Was recently seen in the ER for kidney stone/UTI 10/01/2023 Was started on Duricef.  Feeling better. Has no other complaints or medical concerns today. Healthy female with a healthy lifestyle.  Vegetarian.  HPI   Prior to Admission medications   Medication Sig Start Date End Date Taking? Authorizing Provider  lamoTRIgine (LAMICTAL) 100 MG tablet Take 150 mg by mouth 2 (two) times daily.   Yes [provider]  fluticasone  (FLONASE ) 50 MCG/ACT nasal spray Place 2 sprays into both nostrils daily. Patient not taking: Reported on 10/04/2022 03/21/16   McVey, Almarie Folks, PA-C    Allergies  Allergen Reactions   Aleve [Naproxen Sodium]    Aspirin    Ciprofloxacin  Hcl    Ibuprofen    Macrobid [Nitrofurantoin Monohyd Macro]     Chills, tingling, and nerve pain in legs    Penicillins    Sudafed [Pseudoephedrine Hcl]    Sulfa Antibiotics    Tramadol Nausea And Vomiting    Patient Active Problem List   Diagnosis Date Noted   Anxiety 05/18/2015   Bipolar I disorder (HCC) 10/10/2012    Past Medical History:  Diagnosis Date   Allergy    Anxiety    Bipolar 1 disorder (HCC)    Depression    Goiter     No past surgical history on file.  Social History   Socioeconomic History   Marital status: Single    Spouse name: Not on file   Number of children: Not on file   Years of education: Not on file   Highest education level: Master's degree (e.g., MA, MS, MEng, MEd, MSW, MBA)  Occupational History   Not on file  Tobacco Use   Smoking status: Never   Smokeless tobacco: Never  Substance and Sexual Activity   Alcohol use:  Yes    Alcohol/week: 1.0 standard drink of alcohol    Types: 1 Glasses of wine per week   Drug use: No   Sexual activity: Not Currently  Other Topics Concern   Not on file  Social History Narrative   Not on file   Social Drivers of Health   Financial Resource Strain: Low Risk  (10/04/2023)   Overall Financial Resource Strain (CARDIA)    Difficulty of Paying Living Expenses: Not very hard  Food Insecurity: No Food Insecurity (10/04/2023)   Hunger Vital Sign    Worried About Running Out of Food in the Last Year: Never true    Ran Out of Food in the Last Year: Never true  Transportation Needs: No Transportation Needs (10/04/2023)   PRAPARE - Administrator, Civil Service (Medical): No    Lack of Transportation (Non-Medical): No  Physical Activity: Sufficiently Active (10/04/2023)   Exercise Vital Sign    Days of Exercise per Week: 5 days    Minutes of Exercise per Session: 60 min  Stress: Stress Concern Present (10/04/2023)   [employer] of Occupational Health - Occupational Stress Questionnaire    Feeling of Stress: Very much  Social Connections: Moderately Integrated (10/04/2023)   Social Connection  and Isolation Panel    Frequency of Communication with Friends and Family: More than three times a week    Frequency of Social Gatherings with Friends and Family: More than three times a week    Attends Religious Services: More than 4 times per year    Active Member of [employer] or Organizations: Yes    Attends Banker Meetings: 1 to 4 times per year    Marital Status: Never married  Intimate Partner Violence: Not At Risk (10/04/2023)   Humiliation, Afraid, Rape, and Kick questionnaire    Fear of Current or Ex-Partner: No    Emotionally Abused: No    Physically Abused: No    Sexually Abused: No    Family History  Problem Relation Age of Onset   Hypertension Mother    Cancer Mother        melanoma   Cancer Father        prostate   Bipolar disorder Father     Cancer Maternal Grandmother    Cancer Maternal Grandfather    Hypertension Paternal Grandmother    Cancer Paternal Grandfather      Review of Systems  Constitutional: Negative.  Negative for chills and fever.  HENT: Negative.  Negative for congestion and sore throat.   Respiratory: Negative.  Negative for cough and shortness of breath.   Cardiovascular: Negative.  Negative for chest pain and palpitations.  Gastrointestinal:  Negative for abdominal pain, diarrhea, nausea and vomiting.  Genitourinary: Negative.  Negative for dysuria and hematuria.  Skin: Negative.  Negative for rash.  Neurological: Negative.  Negative for dizziness and headaches.  All other systems reviewed and are negative.   Today's Vitals   10/08/23 0924  BP: 112/80  Pulse: 81  Temp: 98.1 F (36.7 C)  TempSrc: Oral  SpO2: 98%  Weight: 147 lb (66.7 kg)  Height: 5' 9 (1.753 m)   Body mass index is 21.71 kg/m.   Physical Exam Vitals reviewed.  Constitutional:      Appearance: Normal appearance.  HENT:     Head: Normocephalic.     Right Ear: Tympanic membrane, ear canal and external ear normal.     Left Ear: Tympanic membrane, ear canal and external ear normal.     Mouth/Throat:     Mouth: Mucous membranes are moist.     Pharynx: Oropharynx is clear.  Eyes:     Extraocular Movements: Extraocular movements intact.     Conjunctiva/sclera: Conjunctivae normal.     Pupils: Pupils are equal, round, and reactive to light.  Cardiovascular:     Rate and Rhythm: Normal rate and regular rhythm.     Pulses: Normal pulses.     Heart sounds: Normal heart sounds.  Pulmonary:     Effort: Pulmonary effort is normal.     Breath sounds: Normal breath sounds.  Abdominal:     Palpations: Abdomen is soft.     Tenderness: There is no abdominal tenderness.  Musculoskeletal:     Cervical back: No tenderness.  Lymphadenopathy:     Cervical: No cervical adenopathy.  Skin:    General: Skin is warm and dry.      Capillary Refill: Capillary refill takes less than 2 seconds.  Neurological:     General: No focal deficit present.     Mental Status: She is alert and oriented to person, place, and time.  Psychiatric:        Mood and Affect: Mood normal.  Behavior: Behavior normal.      ASSESSMENT & PLAN: Problem List Items Addressed This Visit   None Visit Diagnoses       Routine general medical examination at a health care facility    -  Primary   Relevant Orders   Urinalysis   Urine Culture   CBC with Differential/Platelet   Comprehensive metabolic panel with GFR   Hemoglobin A1c   Lipid panel   Vitamin B12   VITAMIN D  25 Hydroxy (Vit-D Deficiency, Fractures)   Thyroid  Panel With TSH   Magnesium   Folate     Screening for deficiency anemia       Relevant Orders   CBC with Differential/Platelet     Screening for lipoid disorders       Relevant Orders   Lipid panel     Screening for endocrine, metabolic and immunity disorder       Relevant Orders   Urinalysis   Comprehensive metabolic panel with GFR   Hemoglobin A1c   Vitamin B12   VITAMIN D  25 Hydroxy (Vit-D Deficiency, Fractures)   Thyroid  Panel With TSH   Magnesium   Folate     Recent urinary tract infection       Relevant Orders   Urinalysis   Urine Culture      Modifiable risk factors discussed with patient. Anticipatory guidance according to age provided. The following topics were also discussed: Social Determinants of Health Smoking.  Non-smoker Diet and nutrition Benefits of exercise Cancer family history review Vaccinations review and recommendations Cardiovascular risk assessment and need for blood work Mental health including depression and anxiety Fall and accident prevention  Patient Instructions  Health Maintenance, Female Adopting a healthy lifestyle and getting preventive care are important in promoting health and wellness. Ask your health care provider about: The right schedule for you to  have regular tests and exams. Things you can do on your own to prevent diseases and keep yourself healthy. What should I know about diet, weight, and exercise? Eat a healthy diet  Eat a diet that includes plenty of vegetables, fruits, low-fat dairy products, and lean protein. Do not eat a lot of foods that are high in solid fats, added sugars, or sodium. Maintain a healthy weight Body mass index (BMI) is used to identify weight problems. It estimates body fat based on height and weight. Your health care provider can help determine your BMI and help you achieve or maintain a healthy weight. Get regular exercise Get regular exercise. This is one of the most important things you can do for your health. Most adults should: Exercise for at least 150 minutes each week. The exercise should increase your heart rate and make you sweat (moderate-intensity exercise). Do strengthening exercises at least twice a week. This is in addition to the moderate-intensity exercise. Spend less time sitting. Even light physical activity can be beneficial. Watch cholesterol and blood lipids Have your blood tested for lipids and cholesterol at 38 years of age, then have this test every 5 years. Have your cholesterol levels checked more often if: Your lipid or cholesterol levels are high. You are older than 38 years of age. You are at high risk for heart disease. What should I know about cancer screening? Depending on your health history and family history, you may need to have cancer screening at various ages. This may include screening for: Breast cancer. Cervical cancer. Colorectal cancer. Skin cancer. Lung cancer. What should I know about heart disease, diabetes, and  high blood pressure? Blood pressure and heart disease High blood pressure causes heart disease and increases the risk of stroke. This is more likely to develop in people who have high blood pressure readings or are overweight. Have your blood  pressure checked: Every 3-5 years if you are 64-84 years of age. Every year if you are 53 years old or older. Diabetes Have regular diabetes screenings. This checks your fasting blood sugar level. Have the screening done: Once every three years after age 36 if you are at a normal weight and have a low risk for diabetes. More often and at a younger age if you are overweight or have a high risk for diabetes. What should I know about preventing infection? Hepatitis B If you have a higher risk for hepatitis B, you should be screened for this virus. Talk with your health care provider to find out if you are at risk for hepatitis B infection. Hepatitis C Testing is recommended for: Everyone born from [birth year] through 1965. Anyone with known risk factors for hepatitis C. Sexually transmitted infections (STIs) Get screened for STIs, including gonorrhea and chlamydia, if: You are sexually active and are younger than 38 years of age. You are older than 38 years of age and your health care provider tells you that you are at risk for this type of infection. Your sexual activity has changed since you were last screened, and you are at increased risk for chlamydia or gonorrhea. Ask your health care provider if you are at risk. Ask your health care provider about whether you are at high risk for HIV. Your health care provider may recommend a prescription medicine to help prevent HIV infection. If you choose to take medicine to prevent HIV, you should first get tested for HIV. You should then be tested every 3 months for as long as you are taking the medicine. Pregnancy If you are about to stop having your period (premenopausal) and you may become pregnant, seek counseling before you get pregnant. Take 400 to 800 micrograms (mcg) of folic acid  every day if you become pregnant. Ask for birth control (contraception) if you want to prevent pregnancy. Osteoporosis and menopause Osteoporosis is a disease in which  the bones lose minerals and strength with aging. This can result in bone fractures. If you are 2 years old or older, or if you are at risk for osteoporosis and fractures, ask your health care provider if you should: Be screened for bone loss. Take a calcium or vitamin D  supplement to lower your risk of fractures. Be given hormone replacement therapy (HRT) to treat symptoms of menopause. Follow these instructions at home: Alcohol use Do not drink alcohol if: Your health care provider tells you not to drink. You are pregnant, may be pregnant, or are planning to become pregnant. If you drink alcohol: Limit how much you have to: 0-1 drink a day. Know how much alcohol is in your drink. In the [place] one drink equals one 12 oz bottle of beer (355 mL), one 5 oz glass of wine (148 mL), or one 1 oz glass of hard liquor (44 mL). Lifestyle Do not use any products that contain nicotine or tobacco. These products include cigarettes, chewing tobacco, and vaping devices, such as e-cigarettes. If you need help quitting, ask your health care provider. Do not use street drugs. Do not share needles. Ask your health care provider for help if you need support or information about quitting drugs. General instructions Schedule regular health, dental,  and eye exams. Stay current with your vaccines. Tell your health care provider if: You often feel depressed. You have ever been abused or do not feel safe at home. Summary Adopting a healthy lifestyle and getting preventive care are important in promoting health and wellness. Follow your health care provider's instructions about healthy diet, exercising, and getting tested or screened for diseases. Follow your health care provider's instructions on monitoring your cholesterol and blood pressure. This information is not intended to replace advice given to you by your health care provider. Make sure you discuss any questions you have with your health care  provider. Document Revised: 07/05/2020 Document Reviewed: 07/05/2020 Elsevier Patient Education  2024 Elsevier Inc.     Emil Schaumann, MD [facility]

## 2023-10-09 LAB — URINE CULTURE: Result:: NO GROWTH

## 2023-10-09 LAB — THYROID PANEL WITH TSH
Free Thyroxine Index: 2.1 (ref 1.4–3.8)
T3 Uptake: 32 % (ref 22–35)
T4, Total: 6.5 ug/dL (ref 5.1–11.9)
TSH: 0.7 m[IU]/L

## 2023-10-19 DIAGNOSIS — F319 Bipolar disorder, unspecified: Secondary | ICD-10-CM | POA: Diagnosis not present

## 2023-10-19 DIAGNOSIS — Z880 Allergy status to penicillin: Secondary | ICD-10-CM | POA: Diagnosis not present

## 2023-10-19 DIAGNOSIS — R319 Hematuria, unspecified: Secondary | ICD-10-CM | POA: Diagnosis not present

## 2023-10-19 DIAGNOSIS — R300 Dysuria: Secondary | ICD-10-CM | POA: Diagnosis not present

## 2023-10-19 DIAGNOSIS — D72829 Elevated white blood cell count, unspecified: Secondary | ICD-10-CM | POA: Diagnosis not present

## 2023-10-19 DIAGNOSIS — Z881 Allergy status to other antibiotic agents status: Secondary | ICD-10-CM | POA: Diagnosis not present

## 2023-10-19 DIAGNOSIS — Z882 Allergy status to sulfonamides status: Secondary | ICD-10-CM | POA: Diagnosis not present

## 2023-10-19 DIAGNOSIS — Z885 Allergy status to narcotic agent status: Secondary | ICD-10-CM | POA: Diagnosis not present

## 2023-10-19 DIAGNOSIS — R1031 Right lower quadrant pain: Secondary | ICD-10-CM | POA: Diagnosis not present

## 2023-10-24 ENCOUNTER — Ambulatory Visit (INDEPENDENT_AMBULATORY_CARE_PROVIDER_SITE_OTHER): Admitting: Emergency Medicine

## 2023-10-24 ENCOUNTER — Encounter: Payer: Self-pay | Admitting: Emergency Medicine

## 2023-10-24 VITALS — BP 122/82 | HR 84 | Temp 98.3°F | Ht 69.0 in | Wt 144.0 lb

## 2023-10-24 DIAGNOSIS — N200 Calculus of kidney: Secondary | ICD-10-CM | POA: Diagnosis not present

## 2023-10-24 LAB — CBC WITH DIFFERENTIAL/PLATELET
Basophils Absolute: 0 K/uL (ref 0.0–0.1)
Basophils Relative: 0.4 % (ref 0.0–3.0)
Eosinophils Absolute: 0.1 K/uL (ref 0.0–0.7)
Eosinophils Relative: 1.2 % (ref 0.0–5.0)
HCT: 41.3 % (ref 36.0–46.0)
Hemoglobin: 13.4 g/dL (ref 12.0–15.0)
Lymphocytes Relative: 22.8 % (ref 12.0–46.0)
Lymphs Abs: 1.2 K/uL (ref 0.7–4.0)
MCHC: 32.5 g/dL (ref 30.0–36.0)
MCV: 89.1 fl (ref 78.0–100.0)
Monocytes Absolute: 0.4 K/uL (ref 0.1–1.0)
Monocytes Relative: 8.2 % (ref 3.0–12.0)
Neutro Abs: 3.6 K/uL (ref 1.4–7.7)
Neutrophils Relative %: 67.4 % (ref 43.0–77.0)
Platelets: 203 K/uL (ref 150.0–400.0)
RBC: 4.64 Mil/uL (ref 3.87–5.11)
RDW: 13.3 % (ref 11.5–15.5)
WBC: 5.4 K/uL (ref 4.0–10.5)

## 2023-10-24 LAB — COMPREHENSIVE METABOLIC PANEL WITH GFR
ALT: 13 U/L (ref 0–35)
AST: 20 U/L (ref 0–37)
Albumin: 4.6 g/dL (ref 3.5–5.2)
Alkaline Phosphatase: 69 U/L (ref 39–117)
BUN: 16 mg/dL (ref 6–23)
CO2: 30 meq/L (ref 19–32)
Calcium: 9.2 mg/dL (ref 8.4–10.5)
Chloride: 103 meq/L (ref 96–112)
Creatinine, Ser: 0.8 mg/dL (ref 0.40–1.20)
GFR: 93.43 mL/min (ref >60.00)
Glucose, Bld: 94 mg/dL (ref 70–99)
Potassium: 4.3 meq/L (ref 3.5–5.1)
Sodium: 141 meq/L (ref 135–145)
Total Bilirubin: 0.6 mg/dL (ref 0.2–1.2)
Total Protein: 7.4 g/dL (ref 6.0–8.3)

## 2023-10-24 LAB — URINALYSIS
Bilirubin Urine: NEGATIVE
Hgb urine dipstick: NEGATIVE
Ketones, ur: NEGATIVE
Leukocytes,Ua: NEGATIVE
Nitrite: NEGATIVE
Specific Gravity, Urine: <=1.005 — AB (ref 1.000–1.030)
Total Protein, Urine: NEGATIVE
Urine Glucose: NEGATIVE
Urobilinogen, UA: 0.2 (ref 0.0–1.0)
pH: 7 (ref 5.0–8.0)

## 2023-10-24 NOTE — Assessment & Plan Note (Addendum)
 Clinically stable.  No red flag signs or symptoms Emergency department visit notes from 10/19/2023 reviewed Available blood work and urine results from that visit reviewed.  It shows leukocytosis and blood in the urine.  Patient afebrile.  No urinary tract symptoms. Pain management discussed. Advised to follow-up with urologist.  Referral placed today. Recommend repeat urinalysis and repeat blood work

## 2023-10-24 NOTE — Patient Instructions (Signed)
 Kidney Stones Kidney stones are rock-like masses that form inside of the kidneys. Kidneys are organs that make pee (urine). A kidney stone may move into other parts of the urinary tract, including: The tubes that connect the kidneys to the bladder (ureters). The bladder. The tube that carries urine out of the body (urethra). Kidney stones can cause very bad pain and can block the flow of pee. The stone usually leaves your body through your pee. A doctor may need to take out the stone. What are the causes? Kidney stones may be caused by: Too much calcium in the body. This may be caused by too much parathyroid hormone in the blood. Uric acid crystals in the bladder. The body makes uric acid when you eat certain foods. Narrowing of one or both of the ureters. A kidney blockage that you were born with. Past surgery on the kidney or the ureters. What increases the risk? You are more likely to develop this condition if: You have had a kidney stone in the past. Other people in your family have had kidney stones. You do not drink enough water. You eat a diet that is high in protein, salt (sodium), or sugar. You are very overweight (obese). What are the signs or symptoms? Symptoms of a kidney stone may include: Pain in the side of the belly, right below the ribs. Pain usually spreads to the groin. Needing to pee often or right away. Pain when peeing. Blood in your pee. Feeling like you may vomit (nauseous). Vomiting. Fever and chills. How is this treated? Treatment depends on the size, location, and makeup of the kidney stones. The stones will often pass out of the body when you pee. You may need to: Drink more fluid to help pass the stone. In some cases, you may be given fluids through an IV tube at the hospital. Take medicine for pain. Change your diet to help keep kidney stones from coming back. Sometimes, you may need: A procedure to break up kidney stones using a beam of light (laser)  or shock waves. Surgery to remove the kidney stones. Follow these instructions at home: Medicines Take over-the-counter and prescription medicines only as told by your doctor. Ask your doctor if the medicine prescribed to you requires you to avoid driving or using machinery. Eating and drinking Drink enough fluid to keep your pee pale yellow. You may be told to drink at least 8-10 glasses of water each day. This will help you pass the stone. If told by your doctor, change your diet. You may be told to: Limit how much salt you eat. Eat more fruits and vegetables. Limit how much meat, poultry, fish, and eggs you eat. Follow instructions from your doctor about what you may eat and drink. General instructions Collect pee samples as told by your doctor. You may need to collect a pee sample: 24 hours after a stone comes out. 8-12 weeks after a stone comes out, and every 6-12 months after that. Strain your pee every time you pee. Use the strainer that your doctor recommends. Do not throw out the stone. Keep it so that it can be tested by your doctor. Keep all follow-up visits. You may need X-rays and ultrasounds to make sure the stone has come out. How is this prevented? To prevent another kidney stone: Drink enough fluid to keep your pee pale yellow. This is the best way to prevent kidney stones. Eat healthy foods. Avoid certain foods as told by your doctor. You  may be told to eat less protein. Stay at a healthy weight. Where to find more information National Kidney Foundation (NKF): kidney.org [facility]): urologyhealth.org Contact a doctor if: You have pain that gets worse or does not get better with medicine. Get help right away if: You have a fever or chills. You get very bad pain. You get new pain in your belly. You faint. You cannot pee. This information is not intended to replace advice given to you by your health care provider. Make sure you discuss any  questions you have with your health care provider. Document Revised: 10/07/2021 Document Reviewed: 10/07/2021 Elsevier Patient Education  2024 [employer].

## 2023-10-24 NOTE — Progress Notes (Signed)
 Amber Schultz 38 y.o.   Chief Complaint  Patient presents with   Hospitalization Follow-up    Patient here for HFU for right flank pain on 10/01/23 and 10/19/23. She was told they were kidney stones. She states feeling slightly better, last Friday she felt the pain again.     HISTORY OF PRESENT ILLNESS: This is a 38 y.o. female recently seen in the emergency department at [facility] in [place]  due to kidney stones Presented with right flank pain and blood in the urine Blood work showed leukocytosis.  Normal kidney function. Afebrile.  Was treated and released 9 hours later pain-free Had similar episode earlier this month.  Was seen in the same emergency department and had renal CT scan done which was unremarkable Was advised to also follow-up with urologist. No other complaints or medical concerns today.  HPI   Prior to Admission medications   Medication Sig Start Date End Date Taking? Authorizing Provider  lamoTRIgine (LAMICTAL) 100 MG tablet Take 150 mg by mouth 2 (two) times daily.   Yes [provider]    Allergies  Allergen Reactions   Aleve [Naproxen Sodium]    Aspirin    Ciprofloxacin  Hcl    Ibuprofen    Macrobid [Nitrofurantoin Monohyd Macro]     Chills, tingling, and nerve pain in legs    Penicillins    Sudafed [Pseudoephedrine Hcl]    Sulfa Antibiotics    Tramadol Nausea And Vomiting    Patient Active Problem List   Diagnosis Date Noted   Anxiety 05/18/2015   Bipolar I disorder (HCC) 10/10/2012    Past Medical History:  Diagnosis Date   Allergy    Anxiety    Bipolar 1 disorder (HCC)    Depression    Goiter     No past surgical history on file.  Social History   Socioeconomic History   Marital status: Single    Spouse name: Not on file   Number of children: Not on file   Years of education: Not on file   Highest education level: Master's degree (e.g., MA, MS, MEng, MEd, MSW, MBA)  Occupational History   Not  on file  Tobacco Use   Smoking status: Never   Smokeless tobacco: Never  Substance and Sexual Activity   Alcohol use: Yes    Alcohol/week: 1.0 standard drink of alcohol    Types: 1 Glasses of wine per week   Drug use: No   Sexual activity: Not Currently  Other Topics Concern   Not on file  Social History Narrative   Not on file   Social Drivers of Health   Financial Resource Strain: Low Risk  (10/04/2023)   Overall Financial Resource Strain (CARDIA)    Difficulty of Paying Living Expenses: Not very hard  Food Insecurity: No Food Insecurity (10/04/2023)   Hunger Vital Sign    Worried About Running Out of Food in the Last Year: Never true    Ran Out of Food in the Last Year: Never true  Transportation Needs: No Transportation Needs (10/04/2023)   PRAPARE - Administrator, Civil Service (Medical): No    Lack of Transportation (Non-Medical): No  Physical Activity: Sufficiently Active (10/04/2023)   Exercise Vital Sign    Days of Exercise per Week: 5 days    Minutes of Exercise per Session: 60 min  Stress: Stress Concern Present (10/04/2023)   [employer] of Occupational Health - Occupational Stress Questionnaire    Feeling of  Stress: Very much  Social Connections: Moderately Integrated (10/04/2023)   Social Connection and Isolation Panel    Frequency of Communication with Friends and Family: More than three times a week    Frequency of Social Gatherings with Friends and Family: More than three times a week    Attends Religious Services: More than 4 times per year    Active Member of [employer] or Organizations: Yes    Attends Banker Meetings: 1 to 4 times per year    Marital Status: Never married  Intimate Partner Violence: Not At Risk (10/19/2023)   Received from [facility]   Interpersonal Violence    Patient afraid of, threatened, hurt, or sexually abused by someone known to him/her: No    Family History  Problem Relation Age of Onset    Hypertension Mother    Cancer Mother        melanoma   Cancer Father        prostate   Bipolar disorder Father    Cancer Maternal Grandmother    Cancer Maternal Grandfather    Hypertension Paternal Grandmother    Cancer Paternal Grandfather      Review of Systems  Constitutional: Negative.  Negative for fever.  HENT: Negative.  Negative for congestion and sore throat.   Respiratory: Negative.  Negative for cough and shortness of breath.   Cardiovascular: Negative.  Negative for chest pain and palpitations.  Gastrointestinal:  Negative for abdominal pain, diarrhea, nausea and vomiting.  Genitourinary: Negative.  Negative for dysuria and hematuria.  Skin: Negative.  Negative for rash.  Neurological: Negative.  Negative for dizziness.  All other systems reviewed and are negative.   Vitals:   10/24/23 1527  BP: 122/82  Pulse: 84  Temp: 98.3 F (36.8 C)  SpO2: 94%    Physical Exam Vitals reviewed.  Constitutional:      Appearance: Normal appearance.  HENT:     Head: Normocephalic.  Eyes:     Extraocular Movements: Extraocular movements intact.  Cardiovascular:     Rate and Rhythm: Normal rate.  Pulmonary:     Effort: Pulmonary effort is normal.  Skin:    General: Skin is warm and dry.  Neurological:     Mental Status: She is alert and oriented to person, place, and time.  Psychiatric:        Mood and Affect: Mood normal.        Behavior: Behavior normal.      ASSESSMENT & PLAN: I personally spent a total of 32 minutes minutes in the care of the patient today including preparing to see the patient, getting/reviewing separately obtained history, performing a medically appropriate exam/evaluation, counseling and educating, placing orders, referring and communicating with other health care professionals, documenting clinical information in the EHR, independently interpreting results, communicating results, and coordinating care.  Problem List Items Addressed This  Visit       Genitourinary   Nephrolithiasis - Primary   Clinically stable.  No red flag signs or symptoms Emergency department visit notes from 10/19/2023 reviewed Available blood work and urine results from that visit reviewed.  It shows leukocytosis and blood in the urine.  Patient afebrile.  No urinary tract symptoms. Pain management discussed. Advised to follow-up with urologist.  Referral placed today. Recommend repeat urinalysis and repeat blood work       Relevant Orders   Urinalysis   Comprehensive metabolic panel with GFR   CBC with Differential/Platelet   Ambulatory referral to Urology  Urine Culture   Patient Instructions  Kidney Stones Kidney stones are rock-like masses that form inside of the kidneys. Kidneys are organs that make pee (urine). A kidney stone may move into other parts of the urinary tract, including: The tubes that connect the kidneys to the bladder (ureters). The bladder. The tube that carries urine out of the body (urethra). Kidney stones can cause very bad pain and can block the flow of pee. The stone usually leaves your body through your pee. A doctor may need to take out the stone. What are the causes? Kidney stones may be caused by: Too much calcium in the body. This may be caused by too much parathyroid hormone in the blood. Uric acid crystals in the bladder. The body makes uric acid when you eat certain foods. Narrowing of one or both of the ureters. A kidney blockage that you were born with. Past surgery on the kidney or the ureters. What increases the risk? You are more likely to develop this condition if: You have had a kidney stone in the past. Other people in your family have had kidney stones. You do not drink enough water. You eat a diet that is high in protein, salt (sodium), or sugar. You are very overweight (obese). What are the signs or symptoms? Symptoms of a kidney stone may include: Pain in the side of the belly, right  below the ribs. Pain usually spreads to the groin. Needing to pee often or right away. Pain when peeing. Blood in your pee. Feeling like you may vomit (nauseous). Vomiting. Fever and chills. How is this treated? Treatment depends on the size, location, and makeup of the kidney stones. The stones will often pass out of the body when you pee. You may need to: Drink more fluid to help pass the stone. In some cases, you may be given fluids through an IV tube at the hospital. Take medicine for pain. Change your diet to help keep kidney stones from coming back. Sometimes, you may need: A procedure to break up kidney stones using a beam of light (laser) or shock waves. Surgery to remove the kidney stones. Follow these instructions at home: Medicines Take over-the-counter and prescription medicines only as told by your doctor. Ask your doctor if the medicine prescribed to you requires you to avoid driving or using machinery. Eating and drinking Drink enough fluid to keep your pee pale yellow. You may be told to drink at least 8-10 glasses of water each day. This will help you pass the stone. If told by your doctor, change your diet. You may be told to: Limit how much salt you eat. Eat more fruits and vegetables. Limit how much meat, poultry, fish, and eggs you eat. Follow instructions from your doctor about what you may eat and drink. General instructions Collect pee samples as told by your doctor. You may need to collect a pee sample: 24 hours after a stone comes out. 8-12 weeks after a stone comes out, and every 6-12 months after that. Strain your pee every time you pee. Use the strainer that your doctor recommends. Do not throw out the stone. Keep it so that it can be tested by your doctor. Keep all follow-up visits. You may need X-rays and ultrasounds to make sure the stone has come out. How is this prevented? To prevent another kidney stone: Drink enough fluid to keep your pee pale  yellow. This is the best way to prevent kidney stones. Eat healthy foods. Avoid  certain foods as told by your doctor. You may be told to eat less protein. Stay at a healthy weight. Where to find more information National Kidney Foundation (NKF): kidney.org [facility]): urologyhealth.org Contact a doctor if: You have pain that gets worse or does not get better with medicine. Get help right away if: You have a fever or chills. You get very bad pain. You get new pain in your belly. You faint. You cannot pee. This information is not intended to replace advice given to you by your health care provider. Make sure you discuss any questions you have with your health care provider. Document Revised: 10/07/2021 Document Reviewed: 10/07/2021 Elsevier Patient Education  2024 Elsevier Inc.    Emil Schaumann, MD [facility]

## 2023-10-25 ENCOUNTER — Ambulatory Visit: Payer: Self-pay | Admitting: Emergency Medicine

## 2023-11-05 DIAGNOSIS — R310 Gross hematuria: Secondary | ICD-10-CM | POA: Diagnosis not present

## 2023-11-06 DIAGNOSIS — Z113 Encounter for screening for infections with a predominantly sexual mode of transmission: Secondary | ICD-10-CM | POA: Diagnosis not present

## 2023-11-06 DIAGNOSIS — Z1159 Encounter for screening for other viral diseases: Secondary | ICD-10-CM | POA: Diagnosis not present

## 2023-11-06 DIAGNOSIS — Z114 Encounter for screening for human immunodeficiency virus [HIV]: Secondary | ICD-10-CM | POA: Diagnosis not present

## 2023-11-06 DIAGNOSIS — R300 Dysuria: Secondary | ICD-10-CM | POA: Diagnosis not present

## 2023-11-06 DIAGNOSIS — R102 Pelvic and perineal pain: Secondary | ICD-10-CM | POA: Diagnosis not present

## 2023-12-19 DIAGNOSIS — H5213 Myopia, bilateral: Secondary | ICD-10-CM | POA: Diagnosis not present

## 2024-01-03 DIAGNOSIS — R3589 Other polyuria: Secondary | ICD-10-CM | POA: Diagnosis not present

## 2024-01-03 DIAGNOSIS — F319 Bipolar disorder, unspecified: Secondary | ICD-10-CM | POA: Diagnosis not present

## 2024-01-03 DIAGNOSIS — R310 Gross hematuria: Secondary | ICD-10-CM | POA: Diagnosis not present

## 2024-01-21 ENCOUNTER — Encounter: Payer: Self-pay | Admitting: Emergency Medicine

## 2024-01-21 NOTE — Telephone Encounter (Signed)
 Please advise

## 2024-01-21 NOTE — Telephone Encounter (Signed)
 Upper viral respiratory infection running its course.  Rest and stay well-hydrated.  Slightly contagious.  No need to cancel plans.

## 2024-02-29 ENCOUNTER — Ambulatory Visit: Payer: Self-pay

## 2024-02-29 NOTE — Telephone Encounter (Signed)
" °  FYI Only or Action Required?: FYI only for provider: Home Care.  Patient was last seen in primary care on 10/24/2023 by Amber Emil Schanz, MD.  Called Nurse Triage reporting Hypotension.  Symptoms began about a month ago.  Interventions attempted: Nothing.  Symptoms are: unchanged.  Triage Disposition: See Physician Within 24 Hours  Patient/caregiver understands and will follow disposition?: Yes   Copied from CRM [id number]. Topic: Clinical - Red Word Triage >> Feb 29, 2024  3:46 PM Amber Schultz wrote: Red Word that prompted transfer to Nurse Triage: Really low blood pressure that's making her dizzy Reason for Disposition  [1] Systolic BP 90-110 AND [2] taking blood pressure medications AND [3] NOT feeling weak or lightheaded  Answer Assessment - Initial Assessment Questions 1. BLOOD PRESSURE: What is your blood pressure? Did you take at least two measurements 5 minutes apart?      91/63  2. ONSET: When did you take your blood pressure?      Over one month  3. HOW: How did you take your blood pressure? (e.g., visiting nurse, automatic home BP monitor)      automatic  4. HISTORY: Do you have a history of low blood pressure? What is your blood pressure normally?      Denies  5. MEDICINES: Are you taking any medicines for blood pressure? If Yes, ask: Have they been changed recently?     Denies  6. PULSE RATE: Do you know what your pulse rate is?       57 resting  7. OTHER SYMPTOMS: Have you been sick recently? Have you had a recent injury?     Dizziness. Pt states she can still perform ADLs  8. PREGNANCY: Is there any chance you are pregnant? When was your last menstrual period? Denies        Pt reports Hypotension and dizziness Pt scheduled for a visit on  01.05.26 for further evaluation. Pt agrees with plan of care, will call back for any worsening symptoms  Protocols used: Blood Pressure - Low-A-AH  "

## 2024-03-03 ENCOUNTER — Encounter: Payer: Self-pay | Admitting: Internal Medicine

## 2024-03-03 ENCOUNTER — Ambulatory Visit: Payer: Self-pay | Admitting: Internal Medicine

## 2024-03-03 ENCOUNTER — Ambulatory Visit: Admitting: Internal Medicine

## 2024-03-03 VITALS — BP 94/62 | HR 80 | Temp 99.1°F | Ht 69.0 in | Wt 145.0 lb

## 2024-03-03 DIAGNOSIS — E559 Vitamin D deficiency, unspecified: Secondary | ICD-10-CM | POA: Diagnosis not present

## 2024-03-03 DIAGNOSIS — F39 Unspecified mood [affective] disorder: Secondary | ICD-10-CM | POA: Insufficient documentation

## 2024-03-03 DIAGNOSIS — E538 Deficiency of other specified B group vitamins: Secondary | ICD-10-CM | POA: Insufficient documentation

## 2024-03-03 DIAGNOSIS — R031 Nonspecific low blood-pressure reading: Secondary | ICD-10-CM | POA: Diagnosis not present

## 2024-03-03 DIAGNOSIS — I959 Hypotension, unspecified: Secondary | ICD-10-CM

## 2024-03-03 DIAGNOSIS — Z0000 Encounter for general adult medical examination without abnormal findings: Secondary | ICD-10-CM | POA: Insufficient documentation

## 2024-03-03 LAB — HEPATIC FUNCTION PANEL
ALT: 12 U/L (ref 3–35)
AST: 18 U/L (ref 5–37)
Albumin: 4.4 g/dL (ref 3.5–5.2)
Alkaline Phosphatase: 65 U/L (ref 39–117)
Bilirubin, Direct: 0.1 mg/dL (ref 0.1–0.3)
Total Bilirubin: 0.8 mg/dL (ref 0.2–1.2)
Total Protein: 7.1 g/dL (ref 6.0–8.3)

## 2024-03-03 LAB — URINALYSIS, ROUTINE W REFLEX MICROSCOPIC
Bilirubin Urine: NEGATIVE
Hgb urine dipstick: NEGATIVE
Ketones, ur: NEGATIVE
Leukocytes,Ua: NEGATIVE
Nitrite: NEGATIVE
RBC / HPF: NONE SEEN
Specific Gravity, Urine: 1.01 (ref 1.000–1.030)
Total Protein, Urine: NEGATIVE
Urine Glucose: NEGATIVE
Urobilinogen, UA: 0.2 (ref 0.0–1.0)
pH: 7 (ref 5.0–8.0)

## 2024-03-03 LAB — VITAMIN B12: Vitamin B-12: 501 pg/mL (ref 211–911)

## 2024-03-03 LAB — LIPID PANEL
Cholesterol: 173 mg/dL (ref 28–200)
HDL: 62 mg/dL
LDL Cholesterol: 98 mg/dL (ref 10–99)
NonHDL: 110.85
Total CHOL/HDL Ratio: 3
Triglycerides: 62 mg/dL (ref 10.0–149.0)
VLDL: 12.4 mg/dL (ref 0.0–40.0)

## 2024-03-03 LAB — CBC WITH DIFFERENTIAL/PLATELET
Basophils Absolute: 0 K/uL (ref 0.0–0.1)
Basophils Relative: 0.7 % (ref 0.0–3.0)
Eosinophils Absolute: 0.1 K/uL (ref 0.0–0.7)
Eosinophils Relative: 2.2 % (ref 0.0–5.0)
HCT: 41.6 % (ref 36.0–46.0)
Hemoglobin: 13.9 g/dL (ref 12.0–15.0)
Lymphocytes Relative: 40.9 % (ref 12.0–46.0)
Lymphs Abs: 1.7 K/uL (ref 0.7–4.0)
MCHC: 33.3 g/dL (ref 30.0–36.0)
MCV: 87.9 fl (ref 78.0–100.0)
Monocytes Absolute: 0.5 K/uL (ref 0.1–1.0)
Monocytes Relative: 11.8 % (ref 3.0–12.0)
Neutro Abs: 1.8 K/uL (ref 1.4–7.7)
Neutrophils Relative %: 44.4 % (ref 43.0–77.0)
Platelets: 143 K/uL — ABNORMAL LOW (ref 150.0–400.0)
RBC: 4.73 Mil/uL (ref 3.87–5.11)
RDW: 13.5 % (ref 11.5–15.5)
WBC: 4.1 K/uL (ref 4.0–10.5)

## 2024-03-03 LAB — HEMOGLOBIN A1C: Hgb A1c MFr Bld: 5.2 % (ref 4.6–6.5)

## 2024-03-03 LAB — CORTISOL: Cortisol, Plasma: 10.4 ug/dL

## 2024-03-03 LAB — BASIC METABOLIC PANEL WITH GFR
BUN: 17 mg/dL (ref 6–23)
CO2: 29 meq/L (ref 19–32)
Calcium: 9 mg/dL (ref 8.4–10.5)
Chloride: 106 meq/L (ref 96–112)
Creatinine, Ser: 0.75 mg/dL (ref 0.40–1.20)
GFR: 100.7 mL/min
Glucose, Bld: 84 mg/dL (ref 70–99)
Potassium: 4.1 meq/L (ref 3.5–5.1)
Sodium: 140 meq/L (ref 135–145)

## 2024-03-03 LAB — VITAMIN D 25 HYDROXY (VIT D DEFICIENCY, FRACTURES): VITD: 41.31 ng/mL (ref 30.00–100.00)

## 2024-03-03 NOTE — Assessment & Plan Note (Signed)
 Persistently low normal, seems to tolerate ok, did have recent upper resp infection with reduced po intake so pt advised to increase fluids, liberalize salt for a time in the diet, and check labs including cortisol, TSH, UA, cbc, bmp

## 2024-03-03 NOTE — Assessment & Plan Note (Signed)
 Lab Results  Component Value Date   VITAMINB12 334 10/08/2023   Low normal but should restart oral replacement - b12 1000 mcg qd

## 2024-03-03 NOTE — Patient Instructions (Signed)
Please continue all other medications as before, and refills have been done if requested.  Please have the pharmacy call with any other refills you may need.  Please keep your appointments with your specialists as you may have planned  Please go to the LAB at the blood drawing area for the tests to be done  You will be contacted by phone if any changes need to be made immediately.  Otherwise, you will receive a letter about your results with an explanation, but please check with MyChart first.

## 2024-03-03 NOTE — Assessment & Plan Note (Signed)
 Last vitamin D  Lab Results  Component Value Date   VD25OH 20.02 (L) 10/08/2023   Low, to start oral replacement

## 2024-03-03 NOTE — Progress Notes (Signed)
 The test results show that your current treatment is OK, as the tests are stable.  Please continue the same plan.  There is no other need for change of treatment or further evaluation based on these results, at this time.  thanks

## 2024-03-03 NOTE — Progress Notes (Signed)
 Patient ID: Amber Schultz, female   DOB: January 14, 1986, 39 y.o.   MRN: [id number]        Chief Complaint: follow up low normal BP, low b12, low vit d        HPI:  Amber Schultz is a 39 y.o. female here overall doing ok, but despite stable wt seems to have persistent low BP at home similar to today.  Pt denies chest pain, increased sob or doe, wheezing, orthopnea, PND, increased LE swelling, palpitations, dizziness or syncope.  States did have recent 3 wks episode upper resp infection with reduced po intake but has been trying to increase fluids   Pt is vegetarian and admits to very low salt intake.  Pt also stopped b12 supplement about 1 mo.       Wt Readings from Last 3 Encounters:  03/03/24 145 lb (65.8 kg)  10/24/23 144 lb (65.3 kg)  10/08/23 147 lb (66.7 kg)   BP Readings from Last 3 Encounters:  03/03/24 94/62  10/24/23 122/82  10/08/23 112/80         Past Medical History:  Diagnosis Date   Allergy    Anxiety    Bipolar 1 disorder (HCC)    Depression    Goiter    History reviewed. No pertinent surgical history.  reports that she has never smoked. She has never used smokeless tobacco. She reports current alcohol use of about 1.0 standard drink of alcohol per week. She reports that she does not use drugs. family history includes Bipolar disorder in her father; Cancer in her father, maternal grandfather, maternal grandmother, mother, and paternal grandfather; Hypertension in her mother and paternal grandmother. Allergies[1] Medications Ordered Prior to Encounter[2]      ROS:  All others reviewed and negative.  Objective        PE:  BP 94/62 (BP Location: Right Arm, Patient Position: Sitting, Cuff Size: Normal)   Pulse 80   Temp 99.1 F (37.3 C) (Oral)   Ht 5' 9 (1.753 m)   Wt 145 lb (65.8 kg)   LMP 02/13/2024 (Exact Date)   SpO2 99%   BMI 21.41 kg/m                 Constitutional: Pt appears in NAD               HENT: Head: NCAT.                Right Ear:  External ear normal.                 Left Ear: External ear normal.                Eyes: . Pupils are equal, round, and reactive to light. Conjunctivae and EOM are normal               Nose: without d/c or deformity               Neck: Neck supple. Gross normal ROM               Cardiovascular: Normal rate and regular rhythm.                 Pulmonary/Chest: Effort normal and breath sounds without rales or wheezing.                Abd:  Soft, NT, ND, + BS, no organomegaly  Neurological: Pt is alert. At baseline orientation, motor grossly intact               Skin: Skin is warm. No rashes, no other new lesions, LE edema - none               Psychiatric: Pt behavior is normal without agitation   Micro: none  Cardiac tracings I have personally interpreted today:  none  Pertinent Radiological findings (summarize): none   Lab Results  Component Value Date   WBC 5.4 10/24/2023   HGB 13.4 10/24/2023   HCT 41.3 10/24/2023   PLT 203.0 10/24/2023   GLUCOSE 94 10/24/2023   CHOL 157 10/08/2023   TRIG 87.0 10/08/2023   HDL 59.70 10/08/2023   LDLCALC 80 10/08/2023   ALT 13 10/24/2023   AST 20 10/24/2023   NA 141 10/24/2023   K 4.3 10/24/2023   CL 103 10/24/2023   CREATININE 0.80 10/24/2023   BUN 16 10/24/2023   CO2 30 10/24/2023   TSH 0.70 10/08/2023   HGBA1C 5.5 10/08/2023   Assessment/Plan:  Amber Schultz is a 39 y.o. White or Caucasian [1] female with  has a past medical history of Allergy, Anxiety, Bipolar 1 disorder (HCC), Depression, and Goiter.  Low blood pressure reading Persistently low normal, seems to tolerate ok, did have recent upper resp infection with reduced po intake so pt advised to increase fluids, liberalize salt for a time in the diet, and check labs including cortisol, TSH, UA, cbc, bmp  B12 deficiency Lab Results  Component Value Date   VITAMINB12 334 10/08/2023   Low normal but should restart oral replacement - b12 1000 mcg  qd   Vitamin D  deficiency Last vitamin D  Lab Results  Component Value Date   VD25OH 20.02 (L) 10/08/2023   Low, to start oral replacement  Followup: Return if symptoms worsen or fail to improve.  Lynwood Rush, MD 03/03/2024 12:52 PM [facility]  Primary Care - [facility]     [1]  Allergies Allergen Reactions   Aleve [Naproxen Sodium]    Aspirin    Ciprofloxacin  Hcl    Macrobid [Nitrofurantoin Monohyd Macro]     Chills, tingling, and nerve pain in legs    Penicillins    Sudafed [Pseudoephedrine Hcl]    Sulfa Antibiotics    Tramadol Nausea And Vomiting  [2]  Current Outpatient Medications on File Prior to Visit  Medication Sig Dispense Refill   lamoTRIgine (LAMICTAL) 100 MG tablet Take 150 mg by mouth 2 (two) times daily.     No current facility-administered medications on file prior to visit.

## 2024-03-05 LAB — THYROID PANEL WITH TSH
Free Thyroxine Index: 2.1 (ref 1.4–3.8)
T3 Uptake: 31 % (ref 22–35)
T4, Total: 6.8 ug/dL (ref 5.1–11.9)
TSH: 1.15 m[IU]/L

## 2024-10-08 ENCOUNTER — Encounter: Admitting: Emergency Medicine

## 2024-10-10 ENCOUNTER — Ambulatory Visit
